# Patient Record
Sex: Female | Born: 1975 | Race: White | Hispanic: No | Marital: Married | Smoking: Current every day smoker
Health system: Southern US, Community
[De-identification: ages and names within clinical notes are randomized; demographics above are authoritative.]

## PROBLEM LIST (undated history)

## (undated) DIAGNOSIS — C50919 Malignant neoplasm of unspecified site of unspecified female breast: Secondary | ICD-10-CM

## (undated) DIAGNOSIS — Z923 Personal history of irradiation: Secondary | ICD-10-CM

## (undated) HISTORY — PX: ANKLE SURGERY: SHX546

## (undated) HISTORY — PX: BREAST LUMPECTOMY: SHX2

## (undated) HISTORY — DX: Malignant neoplasm of unspecified site of unspecified female breast: C50.919

---

## 1998-02-11 ENCOUNTER — Emergency Department (HOSPITAL_COMMUNITY): Admission: EM | Admit: 1998-02-11 | Discharge: 1998-02-11 | Payer: Self-pay | Admitting: *Deleted

## 1998-08-10 ENCOUNTER — Emergency Department (HOSPITAL_COMMUNITY): Admission: EM | Admit: 1998-08-10 | Discharge: 1998-08-10 | Payer: Self-pay | Admitting: Emergency Medicine

## 2000-03-25 ENCOUNTER — Emergency Department (HOSPITAL_COMMUNITY): Admission: EM | Admit: 2000-03-25 | Discharge: 2000-03-25 | Payer: Self-pay | Admitting: Emergency Medicine

## 2000-11-06 ENCOUNTER — Emergency Department (HOSPITAL_COMMUNITY): Admission: EM | Admit: 2000-11-06 | Discharge: 2000-11-06 | Payer: Self-pay | Admitting: Emergency Medicine

## 2002-11-18 ENCOUNTER — Emergency Department (HOSPITAL_COMMUNITY): Admission: EM | Admit: 2002-11-18 | Discharge: 2002-11-18 | Payer: Self-pay | Admitting: Emergency Medicine

## 2002-11-18 ENCOUNTER — Encounter: Payer: Self-pay | Admitting: Emergency Medicine

## 2007-07-25 ENCOUNTER — Emergency Department (HOSPITAL_COMMUNITY): Admission: EM | Admit: 2007-07-25 | Discharge: 2007-07-25 | Payer: Self-pay | Admitting: Emergency Medicine

## 2007-07-28 ENCOUNTER — Inpatient Hospital Stay (HOSPITAL_COMMUNITY): Admission: RE | Admit: 2007-07-28 | Discharge: 2007-07-29 | Payer: Self-pay | Admitting: Orthopaedic Surgery

## 2008-04-17 ENCOUNTER — Emergency Department (HOSPITAL_COMMUNITY): Admission: EM | Admit: 2008-04-17 | Discharge: 2008-04-17 | Payer: Self-pay | Admitting: Emergency Medicine

## 2008-07-08 ENCOUNTER — Emergency Department (HOSPITAL_COMMUNITY): Admission: EM | Admit: 2008-07-08 | Discharge: 2008-07-08 | Payer: Self-pay | Admitting: Emergency Medicine

## 2008-11-27 ENCOUNTER — Emergency Department (HOSPITAL_COMMUNITY): Admission: EM | Admit: 2008-11-27 | Discharge: 2008-11-27 | Payer: Self-pay | Admitting: Emergency Medicine

## 2010-09-05 ENCOUNTER — Emergency Department (HOSPITAL_COMMUNITY)
Admission: EM | Admit: 2010-09-05 | Discharge: 2010-09-05 | Payer: Self-pay | Source: Home / Self Care | Admitting: Emergency Medicine

## 2010-12-11 ENCOUNTER — Emergency Department: Payer: Self-pay | Admitting: Emergency Medicine

## 2010-12-21 NOTE — Op Note (Signed)
NAME:  Brittney Price, Brittney Price                 ACCOUNT NO.:  [id number]   MEDICAL RECORD NO.:  [id number]          PATIENT TYPE:  INP   LOCATION:  5018                         FACILITY:  [facility]   PHYSICIAN:  Vanita Panda. Magnus Ivan, M.D.DATE OF BIRTH:  01-02-1976   DATE OF PROCEDURE:  07/27/2007  DATE OF DISCHARGE:                               OPERATIVE REPORT   PREOPERATIVE DIAGNOSIS:  Right unstable trimalleolar ankle fracture.   POSTOPERATIVE DIAGNOSIS:  Right unstable trimalleolar ankle fracture.   PROCEDURE:  Open reduction internal fixation of right ankle lateral  malleolus and medial malleolus only.   SURGEON:  Vanita Panda. Magnus Ivan, MD.   ANESTHESIA:  General.   TOURNIQUET TIME:  One hour 41 minutes.   ANTIBIOTICS:  One gram IV Ancef.   INTRAOPERATIVE BLOOD LOSS:  Minimal.   COMPLICATIONS:  None.   INDICATIONS:  Briefly, [name] is a 35 year old who, 14 days ago,  sustained a complex right ankle fracture-dislocation.  She was seen in  the emergency room in [place], [place], and apparently set up  to see an Investment banker, operational and two days following this, to have  surgery.  However, for some reason she did not go to see this doctor.  After continued pain in her ankle and an obvious deformity in a splint,  she came to [facility] ER this past Wednesday and  was found to have a subluxed ankle.  I was in the middle of a trauma and  they called me and said that they had apparently had it in better  alignment and would send her to follow up immediately in my clinic the  next morning.  She showed up in clinic yesterday and had still a  subluxed ankle and fracture blisters on the medial side that looked like  they were old and had been healing.  I  recommended she undergo  immediate open reduction internal fixation of the fracture.  Risks and  benefits of this were explained her and well understood and she agreed  to proceed with surgery.   DESCRIPTION OF PROCEDURE:  After informed was obtained, appropriate  right leg was marked.  She is brought to the operating room, placed  supine on the operating table.  She was already in her splint and of  note, in the office yesterday, I did reduce the ankle to a better  aligned position.  Once she was placed on the table, general anesthesia  was then obtained.  A nonsterile tourniquet placed around her upper  right thigh and her right leg and ankle were prepped and draped with  DuraPrep and sterile drapes.  Time-out was called and she was identified  as the correct patient and correct extremity.  I then wrapped out the  ankle and foot with an Esmarch and tourniquet inflated 350 mm of  pressure.  An incision was made along the lateral fibula and this was  carried down to fibula site.  There was a long spiral fracture but no  significant comminution on the lateral side.  I cleaned the fracture  debris so I  was able to easily free the fracture up.  Using reduction  forceps, I was then able to reduce the fracture.  I placed a single 3.5  mm lag screw from anterior to posterior and oblique angle lagging the  fracture and this held it nicely.  I then placed a Borkenhagen [employer] Nephew  contoured fibula plate along the fibula and secured this proximally and  distally to buttress the ankle.  Next, attention was turned the medial  malleolus piece.  I made an incision directly over the medial malleolus  and again the fracture blisters on the medial side had healed and I  peeled away dead skin from this area.  This was carried down the  fracture site and  there was heavy comminution here and it was difficult  to ascertain which aspect of the fibula that helped bring it into an  anatomic position.  Once I was able to finally tease it into what I felt  to be anatomic position, I placed two guide pins for 4-0 cannulated  screws in a oblique fashion to the fracture site in a parallel fashion  as well.  I then  drilled this with 2 cannulated  drill bit and two  partially threaded 4-0 cancellous screws were placed lagging this  fracture.  I then did get some supplemental bone graft with G[place]on  including ortho blend mixture of chips and putty to put on the deficit  on the medial side.  Under direct fluoroscopic guidance, the ankle was  assessed and it was found to be stable including the ankle syndesmosis.  I then copiously irrigated all wounds and closed the deep tissue with  zero Vicryl on both sides.  This was followed by 2-0 Vicryl in the  subcutaneous tissue and interrupted 2-0 nylon in the skin on both sides.  Xeroform followed by well-padded sterile dressing was applied.  The  tourniquet was let down.  The toes pinkened nicely.  I put on a well-  padded posterior plaster splint with stirrups as well.  The patient was  awakened, extubated and taken to the recovery room in stable condition.  All final counts were correct and there were no complications noted.      Vanita Panda. Magnus Ivan, M.D.  Electronically Signed     CYB/MEDQ  D:  07/27/2007  T:  07/29/2007  Job:  [id number]

## 2011-02-06 ENCOUNTER — Emergency Department (HOSPITAL_COMMUNITY): Admission: EM | Admit: 2011-02-06 | Payer: Self-pay | Source: Home / Self Care

## 2011-05-12 LAB — URINE MICROSCOPIC-ADD ON

## 2011-05-12 LAB — WET PREP, GENITAL: Yeast Wet Prep HPF POC: NONE SEEN

## 2011-05-12 LAB — URINALYSIS, ROUTINE W REFLEX MICROSCOPIC
Bilirubin Urine: NEGATIVE
Glucose, UA: NEGATIVE mg/dL
Hgb urine dipstick: NEGATIVE
Specific Gravity, Urine: 1.024 (ref 1.005–1.030)
Urobilinogen, UA: 1 mg/dL (ref 0.0–1.0)

## 2011-05-12 LAB — RPR: RPR Ser Ql: NONREACTIVE

## 2011-05-12 LAB — GC/CHLAMYDIA PROBE AMP, GENITAL: GC Probe Amp, Genital: NEGATIVE

## 2011-05-13 LAB — BASIC METABOLIC PANEL
BUN: 9
CO2: 29
Chloride: 101
Creatinine, Ser: 0.63
Glucose, Bld: 112 — ABNORMAL HIGH
Potassium: 3.9

## 2011-05-13 LAB — CBC
HCT: 40.2
MCHC: 34.1
MCV: 91
Platelets: 233
RDW: 12.4

## 2011-08-15 ENCOUNTER — Emergency Department (HOSPITAL_BASED_OUTPATIENT_CLINIC_OR_DEPARTMENT_OTHER)
Admission: EM | Admit: 2011-08-15 | Discharge: 2011-08-16 | Disposition: A | Discharge status: Home / Self Care | Payer: Self-pay | Attending: Emergency Medicine | Admitting: Emergency Medicine

## 2011-08-15 ENCOUNTER — Encounter: Payer: Self-pay | Admitting: *Deleted

## 2011-08-15 DIAGNOSIS — F17200 Nicotine dependence, unspecified, uncomplicated: Secondary | ICD-10-CM | POA: Insufficient documentation

## 2011-08-15 DIAGNOSIS — L259 Unspecified contact dermatitis, unspecified cause: Secondary | ICD-10-CM | POA: Insufficient documentation

## 2011-08-15 NOTE — ED Notes (Signed)
Pt reports she noticed rash on her hands tonight before going into work- denies known exposure

## 2011-08-16 MED ORDER — PREDNISONE 50 MG PO TABS
ORAL_TABLET | ORAL | Status: AC
  Filled 2011-08-16: qty 1

## 2011-08-16 MED ORDER — FAMOTIDINE 20 MG PO TABS: 20.0000 mg | ORAL_TABLET | Freq: Two times a day (BID) | ORAL | Status: DC

## 2011-08-16 MED ORDER — PREDNISONE 50 MG PO TABS
60.0000 mg | ORAL_TABLET | Freq: Every day | ORAL | Status: DC
  Administered 2011-08-16: 60 mg via ORAL

## 2011-08-16 MED ORDER — DIPHENHYDRAMINE HCL 25 MG PO TABS: 50.0000 mg | ORAL_TABLET | Freq: Four times a day (QID) | ORAL | Status: AC | PRN

## 2011-08-16 MED ORDER — FAMOTIDINE 20 MG PO TABS
20.0000 mg | ORAL_TABLET | Freq: Once | ORAL | Status: AC
  Administered 2011-08-16: 20 mg via ORAL
  Filled 2011-08-16: qty 1

## 2011-08-16 MED ORDER — DIPHENHYDRAMINE HCL 50 MG PO CAPS
50.0000 mg | ORAL_CAPSULE | Freq: Once | ORAL | Status: AC
  Administered 2011-08-16: 50 mg via ORAL
  Filled 2011-08-16: qty 1

## 2011-08-16 MED ORDER — PREDNISONE 10 MG PO TABS
ORAL_TABLET | ORAL | Status: AC
  Filled 2011-08-16: qty 1

## 2011-08-16 MED ORDER — DIPHENHYDRAMINE HCL 25 MG PO CAPS
ORAL_CAPSULE | ORAL | Status: AC
  Administered 2011-08-16: 50 mg via ORAL
  Filled 2011-08-16: qty 2

## 2011-08-16 NOTE — ED Provider Notes (Signed)
History     CSN: [id number]  Arrival date & time 08/15/11  2300   First MD Initiated Contact with Patient 08/15/11 2311      Chief Complaint  Patient presents with  . Rash    (Consider location/radiation/quality/duration/timing/severity/associated sxs/prior treatment) The history is provided by the patient.   patient reports developing a rash on her hands and forearms prior to going to work this evening.  She reports it itches.  She denies hives.  She has no prior history of anaphylaxis, allergic reaction, eczema, asthma or other food insensitivities.  She has not tried anything for the rash.  She has not attempted Benadryl.  She has no changes in lotions soaps or detergents.  She reports she's been at her job for 10 years and nothing seems to change that at that would be a new exposure.  Nothing worsens her symptoms.  Nothing improves her symptoms.  Her symptoms are constant.  History reviewed. No pertinent past medical history.  Past Surgical History  Procedure Date  . Ankle surgery     No family history on file.  History  Substance Use Topics  . Smoking status: Current Everyday Smoker  . Smokeless tobacco: Never Used  . Alcohol Use: Yes     occasional    OB History    Grav Para Term Preterm Abortions TAB SAB Ect Mult Living                  Review of Systems  All other systems reviewed and are negative.    Allergies  Review of patient's allergies indicates no known allergies.  Home Medications   Current Outpatient Rx  Name Route Sig Dispense Refill  . DIPHENHYDRAMINE HCL 25 MG PO TABS Oral Take 2 tablets (50 mg total) by mouth every 6 (six) hours as needed for itching. 20 tablet 0  . FAMOTIDINE 20 MG PO TABS Oral Take 1 tablet (20 mg total) by mouth 2 (two) times daily. 7 tablet 0    BP 112/63  Pulse 67  Temp(Src) 98.9 F (37.2 C) (Oral)  Resp 18  Ht 5\' 8"  (1.727 m)  Wt 160 lb (72.576 kg)  BMI 24.33 kg/m2  SpO2 100%  LMP 07/25/2011  Physical Exam    Nursing note and vitals reviewed. Constitutional: She is oriented to person, place, and time. She appears well-developed and well-nourished. No distress.  HENT:  Head: Normocephalic and atraumatic.       Speech is normal.  No stridor.  Eyes: EOM are normal.  Neck: Normal range of motion.  Cardiovascular: Regular rhythm.   Pulmonary/Chest: Effort normal. No stridor.  Abdominal: Soft.  Musculoskeletal: Normal range of motion.  Neurological: She is alert and oriented to person, place, and time.  Skin:       Diffuse erythema and warmth with evidence of light excoriations.  No hives noted.  Psychiatric: She has a normal mood and affect. Judgment normal.    ED Course  Procedures (including critical care time)  Labs Reviewed - No data to display No results found.   1. Contact dermatitis       MDM  Other contact dermatitis versus allergic reaction.  Will be treated as such.  No signs of anaphylaxis.  Patient is well-appearing.  Home with instructions for Benadryl and Pepcid.  She's been given a single dose of prednisone here in the emergency department.  She was instructed to return to the ER for new or worsening symptoms  Lyanne Co, MD 08/16/11 [phone]

## 2012-05-21 ENCOUNTER — Emergency Department (HOSPITAL_COMMUNITY)
Admission: EM | Admit: 2012-05-21 | Discharge: 2012-05-21 | Disposition: A | Discharge status: Home / Self Care | Payer: Self-pay | Attending: Emergency Medicine | Admitting: Emergency Medicine

## 2012-05-21 ENCOUNTER — Encounter (HOSPITAL_COMMUNITY): Payer: Self-pay | Admitting: Emergency Medicine

## 2012-05-21 DIAGNOSIS — R5381 Other malaise: Secondary | ICD-10-CM | POA: Insufficient documentation

## 2012-05-21 DIAGNOSIS — R5383 Other fatigue: Secondary | ICD-10-CM

## 2012-05-21 DIAGNOSIS — R112 Nausea with vomiting, unspecified: Secondary | ICD-10-CM | POA: Insufficient documentation

## 2012-05-21 DIAGNOSIS — F17200 Nicotine dependence, unspecified, uncomplicated: Secondary | ICD-10-CM | POA: Insufficient documentation

## 2012-05-21 NOTE — ED Notes (Signed)
Pt sts N/V yesterday that is now resolved and some fatigue but sts is here for work note

## 2012-05-22 NOTE — ED Provider Notes (Signed)
History     CSN: [id number]  Arrival date & time 05/21/12  1429   First MD Initiated Contact with Patient 05/21/12 1853      Chief Complaint  Patient presents with  . Nausea  . Fatigue    (Consider location/radiation/quality/duration/timing/severity/associated sxs/prior treatment) HPISusan R Price is a 36 y.o. female who awoke early this morning and had 6 episodes of nausea and vomiting. This was associated with some fatigue. This was severe for a short period of time. She says this is resolved and she's been tolerating food and fluids well. Patient presents to the emergency department because she wanted a work note so that she may return to her job at [employer]. Currently she denies any chest pain, shortness of breath, fevers or chills, hematemesis, hematochezia or melena. Symptoms have completely resolved and she has not taken anything for them. She refuses any treatment at this time-she says she does not want to take medications as she feels fine  History reviewed. No pertinent past medical history.  Past Surgical History  Procedure Date  . Ankle surgery     History reviewed. No pertinent family history.  History  Substance Use Topics  . Smoking status: Current Every Day Smoker  . Smokeless tobacco: Never Used  . Alcohol Use: Yes     occasional    OB History    Grav Para Term Preterm Abortions TAB SAB Ect Mult Living                  Review of Systems  Allergies  Review of patient's allergies indicates no known allergies.  Home Medications  No current outpatient prescriptions on file.  BP 128/79  Pulse 68  Temp 98.5 F (36.9 C) (Oral)  Resp 16  SpO2 98%  Physical Exam  Nursing notes reviewed.  Electronic medical record reviewed. VITAL SIGNS:   Filed Vitals:   05/21/12 1446  BP: 128/79  Pulse: 68  Temp: 98.5 F (36.9 C)  TempSrc: Oral  Resp: 16  SpO2: 98%   CONSTITUTIONAL: Awake, oriented, appears non-toxic HENT: Atraumatic, normocephalic,  oral mucosa pink and moist, airway patent. Nares patent without drainage. External ears normal. EYES: Conjunctiva clear, EOMI, PERRLA NECK: Trachea midline, non-tender, supple CARDIOVASCULAR: Normal heart rate, Normal rhythm, No murmurs, rubs, gallops PULMONARY/CHEST: Clear to auscultation, no rhonchi, wheezes, or rales. Symmetrical breath sounds. Non-tender. ABDOMINAL: Non-distended, soft, non-tender - no rebound or guarding.  BS normal. NEUROLOGIC: Non-focal, moving all four extremities, no gross sensory or motor deficits. EXTREMITIES: No clubbing, cyanosis, or edema SKIN: Warm, Dry, No erythema, No rash  ED Course  Procedures (including critical care time)  Labs Reviewed - No data to display No results found.   1. Nausea and vomiting   2. Fatigue       MDM  Brittney Price is a 36 y.o. female presents with self resolving nausea and vomiting. Patient like work note so that she may return to her job at [employer]. Work note will be provided. Patient refuses Zofran. Vital signs are stable and within normal limits-she does not require any IV therapy. Do not think any imaging or laboratory studies are indicated at this time. We discharged home stable and good condition and instructed to return to the ER she is unable to keep down fluids by mouth. She's questions are answered she agrees to medical plan and is discharged home.         Jones Skene, MD 05/22/12 4098

## 2012-12-31 ENCOUNTER — Encounter (HOSPITAL_COMMUNITY): Payer: Self-pay | Admitting: Emergency Medicine

## 2012-12-31 ENCOUNTER — Emergency Department (HOSPITAL_COMMUNITY)
Admission: EM | Admit: 2012-12-31 | Discharge: 2012-12-31 | Disposition: A | Discharge status: Home / Self Care | Payer: Self-pay | Attending: Emergency Medicine | Admitting: Emergency Medicine

## 2012-12-31 DIAGNOSIS — F17200 Nicotine dependence, unspecified, uncomplicated: Secondary | ICD-10-CM | POA: Insufficient documentation

## 2012-12-31 DIAGNOSIS — L509 Urticaria, unspecified: Secondary | ICD-10-CM | POA: Insufficient documentation

## 2012-12-31 MED ORDER — HYDROCORTISONE 1 % EX CREA: TOPICAL_CREAM | CUTANEOUS | Status: DC

## 2012-12-31 NOTE — ED Provider Notes (Signed)
Medical screening examination/treatment/procedure(s) were performed by non-physician practitioner and as supervising physician I was immediately available for consultation/collaboration.  Dalon Reichart, MD 12/31/12 1525 

## 2012-12-31 NOTE — ED Provider Notes (Signed)
History     CSN: [id number]  Arrival date & time 12/31/12  1212   First MD Initiated Contact with Patient 12/31/12 1257      Chief Complaint  Patient presents with  . Rash    B/L arms    (Consider location/radiation/quality/duration/timing/severity/associated sxs/prior treatment) HPI  Pt to the ER for hives to bilateral arms. Unsure of what she came into contact with but has a new body wash. Denies it being on any other part of her body. Denies it being painful or pruritic. She noticed it when her daughter pointed it out yesterday. She used her brothers hydrocortisone cream on the area and it got  better afterwards. No CP, SOB, n/v/d, weakness or fevers. nad vss  History reviewed. No pertinent past medical history.  Past Surgical History  Procedure Laterality Date  . Ankle surgery      No family history on file.  History  Substance Use Topics  . Smoking status: Current Every Day Smoker  . Smokeless tobacco: Never Used  . Alcohol Use: Yes     Comment: occasional    OB History   Grav Para Term Preterm Abortions TAB SAB Ect Mult Living                  Review of Systems  All other systems reviewed and are negative.    Allergies  Review of patient's allergies indicates no known allergies.  Home Medications   Current Outpatient Rx  Name  Route  Sig  Dispense  Refill  . ibuprofen (ADVIL,MOTRIN) 200 MG tablet   Oral   Take 400 mg by mouth every 6 (six) hours as needed for pain.         . Multiple Vitamin (MULTIVITAMIN WITH MINERALS) TABS   Oral   Take 1 tablet by mouth daily.         . hydrocortisone cream 1 %      Apply to affected area 2 times daily   30 g   0     BP 123/75  Pulse 82  Temp(Src) 98.7 F (37.1 C) (Oral)  Resp 16  SpO2 96%  LMP 12/24/2012  Physical Exam  Nursing note and vitals reviewed. Constitutional: She appears well-developed and well-nourished. No distress.  HENT:  Head: Normocephalic and atraumatic.  Eyes: Pupils  are equal, round, and reactive to light.  Neck: Normal range of motion. Neck supple.  Cardiovascular: Normal rate and regular rhythm.   Pulmonary/Chest: Effort normal.  Abdominal: Soft.  Neurological: She is alert.  Skin: Skin is warm and dry.       ED Course  Procedures (including critical care time)  Labs Reviewed - No data to display No results found.   1. Urticaria       MDM  Rx Hydrocortisone cream  Referral to derm.  Pt has been advised of the symptoms that warrant their return to the ED. Patient has voiced understanding and has agreed to follow-up with the PCP or specialist.         Dorthula Matas, PA-C 12/31/12 1326

## 2012-12-31 NOTE — ED Notes (Signed)
Pt reports noticed red raised rash to B/L arms. Pt denies itching. Pt has been using a new body wash from [facility] x 2 weeks. Pt has not noticed any other areas with rash.

## 2013-05-19 ENCOUNTER — Encounter (HOSPITAL_COMMUNITY): Payer: Self-pay | Admitting: Emergency Medicine

## 2013-05-19 ENCOUNTER — Emergency Department (HOSPITAL_COMMUNITY)
Admission: EM | Admit: 2013-05-19 | Discharge: 2013-05-19 | Disposition: A | Discharge status: Home / Self Care | Payer: Self-pay | Attending: Emergency Medicine | Admitting: Emergency Medicine

## 2013-05-19 DIAGNOSIS — Z3202 Encounter for pregnancy test, result negative: Secondary | ICD-10-CM | POA: Insufficient documentation

## 2013-05-19 DIAGNOSIS — M779 Enthesopathy, unspecified: Secondary | ICD-10-CM

## 2013-05-19 DIAGNOSIS — A499 Bacterial infection, unspecified: Secondary | ICD-10-CM | POA: Insufficient documentation

## 2013-05-19 DIAGNOSIS — Z79899 Other long term (current) drug therapy: Secondary | ICD-10-CM | POA: Insufficient documentation

## 2013-05-19 DIAGNOSIS — F17200 Nicotine dependence, unspecified, uncomplicated: Secondary | ICD-10-CM | POA: Insufficient documentation

## 2013-05-19 DIAGNOSIS — N760 Acute vaginitis: Secondary | ICD-10-CM | POA: Insufficient documentation

## 2013-05-19 DIAGNOSIS — Z202 Contact with and (suspected) exposure to infections with a predominantly sexual mode of transmission: Secondary | ICD-10-CM | POA: Insufficient documentation

## 2013-05-19 DIAGNOSIS — Z9889 Other specified postprocedural states: Secondary | ICD-10-CM | POA: Insufficient documentation

## 2013-05-19 DIAGNOSIS — B9689 Other specified bacterial agents as the cause of diseases classified elsewhere: Secondary | ICD-10-CM | POA: Insufficient documentation

## 2013-05-19 DIAGNOSIS — M65839 Other synovitis and tenosynovitis, unspecified forearm: Secondary | ICD-10-CM | POA: Insufficient documentation

## 2013-05-19 LAB — URINALYSIS, ROUTINE W REFLEX MICROSCOPIC
Bilirubin Urine: NEGATIVE
Ketones, ur: NEGATIVE mg/dL
Nitrite: NEGATIVE
Protein, ur: NEGATIVE mg/dL
pH: 5.5 (ref 5.0–8.0)

## 2013-05-19 LAB — WET PREP, GENITAL: Trich, Wet Prep: NONE SEEN

## 2013-05-19 LAB — URINE MICROSCOPIC-ADD ON

## 2013-05-19 MED ORDER — HYDROCODONE-ACETAMINOPHEN 5-325 MG PO TABS: 1.0000 | ORAL_TABLET | Freq: Four times a day (QID) | ORAL | Status: DC | PRN

## 2013-05-19 MED ORDER — METRONIDAZOLE 500 MG PO TABS: 500.0000 mg | ORAL_TABLET | Freq: Two times a day (BID) | ORAL | Status: DC

## 2013-05-19 NOTE — ED Provider Notes (Signed)
CSN: [id number]     Arrival date & time 05/19/13  1750 History   First MD Initiated Contact with Patient 05/19/13 1817     Chief Complaint  Patient presents with  . Wrist Pain  . Arm Pain  . Exposure to STD   (Consider location/radiation/quality/duration/timing/severity/associated sxs/prior Treatment) HPI Comments: Pt states that she is here for multiple complaints pt that that she is having pain in the medial aspect of the right wrist:pt states that she does a lot repetitive motion at work:pt states that she also has some left shoulder pain when she does heavy lifting and her arms are out:pt states that she is a homosexual relationship and her partner has had multiple other partners and she would like to be tested for stds including hiv  The history is provided by the patient. No language interpreter was used.    History reviewed. No pertinent past medical history. Past Surgical History  Procedure Laterality Date  . Ankle surgery     History reviewed. No pertinent family history. History  Substance Use Topics  . Smoking status: Current Every Day Smoker  . Smokeless tobacco: Never Used  . Alcohol Use: Yes     Comment: occasional   OB History   Grav Para Term Preterm Abortions TAB SAB Ect Mult Living                 Review of Systems  Constitutional: Negative.   Respiratory: Negative.   Cardiovascular: Negative.     Allergies  Review of patient's allergies indicates no known allergies.  Home Medications   Current Outpatient Rx  Name  Route  Sig  Dispense  Refill  . hydrocortisone cream 1 %      Apply to affected area 2 times daily   30 g   0   . ibuprofen (ADVIL,MOTRIN) 200 MG tablet   Oral   Take 400 mg by mouth every 6 (six) hours as needed for pain.         . Multiple Vitamin (MULTIVITAMIN WITH MINERALS) TABS   Oral   Take 1 tablet by mouth daily.          BP 124/87  Pulse 61  Temp(Src) 98.1 F (36.7 C) (Oral)  Resp 16  Ht 5\' 8"  (1.727 m)  Wt  155 lb (70.308 kg)  BMI 23.57 kg/m2  SpO2 99% Physical Exam  Nursing note and vitals reviewed. Constitutional: She is oriented to person, place, and time. She appears well-developed and well-nourished.  HENT:  Head: Normocephalic and atraumatic.  Eyes: Conjunctivae and EOM are normal.  Cardiovascular: Normal rate and regular rhythm.   Pulmonary/Chest: Effort normal and breath sounds normal.  Abdominal: Soft. Bowel sounds are normal.  Genitourinary: Vaginal discharge found.  -cmt  Musculoskeletal: Normal range of motion.  Tender in the medial aspect of the right wrist:pt has full rom:no gross deformity noted:left arm non tender to palpation  Neurological: She is alert and oriented to person, place, and time.  Skin: Skin is warm and dry.    ED Course  Procedures (including critical care time) Labs Review Labs Reviewed  WET PREP, GENITAL - Abnormal; Notable for the following:    Clue Cells Wet Prep HPF POC FEW (*)    WBC, Wet Prep HPF POC MANY (*)    All other components within normal limits  URINALYSIS, ROUTINE W REFLEX MICROSCOPIC - Abnormal; Notable for the following:    Leukocytes, UA MODERATE (*)    All other components within  normal limits  URINE MICROSCOPIC-ADD ON - Abnormal; Notable for the following:    Squamous Epithelial / LPF FEW (*)    All other components within normal limits  GC/CHLAMYDIA PROBE AMP  RPR  HIV ANTIBODY (ROUTINE TESTING)   Imaging Review No results found.  EKG Interpretation   None       MDM   1. Tendonitis   2. BV (bacterial vaginosis)   3. Possible exposure to STD    Std cultures sent:pt placed in wrist splint and given ortho follow up:pt sent home with flagyl and vicodin    Teressa Lower, NP 05/19/13 1935

## 2013-05-19 NOTE — ED Notes (Signed)
Pt reports left arm pain that occurs when reaching to get something and extending her arm, having pain to right wrist from repetitive motion at work. Also requesting to get checked for stds due to possible exposure but denies any symptoms.

## 2013-05-19 NOTE — Progress Notes (Signed)
Orthopedic Tech Progress Note Patient Details:  Brittney Price December 02, 1975 [id number]  Ortho Devices Type of Ortho Device: Velcro wrist splint Ortho Device/Splint Location: rue Ortho Device/Splint Interventions: Application   Nikki Dom 05/19/2013, 8:32 PM

## 2013-05-19 NOTE — ED Provider Notes (Signed)
Medical screening examination/treatment/procedure(s) were performed by non-physician practitioner and as supervising physician I was immediately available for consultation/collaboration.  Kristen N Ward, DO 05/19/13 2010 

## 2013-05-20 LAB — HIV ANTIBODY (ROUTINE TESTING W REFLEX): HIV: NONREACTIVE

## 2013-05-20 LAB — GC/CHLAMYDIA PROBE AMP: GC Probe RNA: NEGATIVE

## 2013-05-20 LAB — POCT PREGNANCY, URINE: Preg Test, Ur: NEGATIVE

## 2013-06-09 ENCOUNTER — Encounter (HOSPITAL_COMMUNITY): Payer: Self-pay | Admitting: Emergency Medicine

## 2013-06-09 ENCOUNTER — Emergency Department (HOSPITAL_COMMUNITY)
Admission: EM | Admit: 2013-06-09 | Discharge: 2013-06-09 | Disposition: A | Discharge status: Home / Self Care | Payer: Self-pay | Attending: Emergency Medicine | Admitting: Emergency Medicine

## 2013-06-09 DIAGNOSIS — R197 Diarrhea, unspecified: Secondary | ICD-10-CM | POA: Insufficient documentation

## 2013-06-09 DIAGNOSIS — Z79899 Other long term (current) drug therapy: Secondary | ICD-10-CM | POA: Insufficient documentation

## 2013-06-09 DIAGNOSIS — F17200 Nicotine dependence, unspecified, uncomplicated: Secondary | ICD-10-CM | POA: Insufficient documentation

## 2013-06-09 DIAGNOSIS — R5381 Other malaise: Secondary | ICD-10-CM | POA: Insufficient documentation

## 2013-06-09 DIAGNOSIS — J029 Acute pharyngitis, unspecified: Secondary | ICD-10-CM | POA: Insufficient documentation

## 2013-06-09 NOTE — ED Provider Notes (Signed)
CSN: [id number]     Arrival date & time 06/09/13  1546 History   First MD Initiated Contact with Patient 06/09/13 1603     This chart was scribed for Marlin Canary, by Ladona Ridgel Day, ED scribe. This patient was seen in room TR05C/TR05C and the patient's care was started at 1603.  Chief Complaint  Patient presents with  . Sore Throat   Patient is a 37 y.o. female presenting with pharyngitis. The history is provided by the patient. No language interpreter was used.  Sore Throat This is a new problem. The current episode started yesterday. The problem occurs constantly. The problem has not changed since onset.Pertinent negatives include no headaches and no shortness of breath. Nothing aggravates the symptoms. Nothing relieves the symptoms. She has tried nothing for the symptoms.   HPI Comments: Brittney Price is a 37 y.o. female who presents to the Emergency Department complaining of constant, gradually worsened sore throat, onset last PM and concerned that she might have strept throat. She states associated congestion, cough, diarrhea and mild myalgias. She denies HA, sinus pressure. She has 1 previous episode of strept throat. She is unsure of any fever.   History reviewed. No pertinent past medical history. Past Surgical History  Procedure Laterality Date  . Ankle surgery     No family history on file. History  Substance Use Topics  . Smoking status: Current Every Day Smoker  . Smokeless tobacco: Never Used  . Alcohol Use: Yes     Comment: occasional   OB History   Grav Para Term Preterm Abortions TAB SAB Ect Mult Living                 Review of Systems  Constitutional: Negative for fever and chills.  HENT: Positive for congestion and sore throat. Negative for ear pain.   Respiratory: Positive for cough. Negative for shortness of breath.   Gastrointestinal: Negative for nausea and vomiting.  Neurological: Negative for weakness and headaches.  All other systems reviewed and are  negative.   A complete 10 system review of systems was obtained and all systems are negative except as noted in the HPI and PMH.   Allergies  Review of patient's allergies indicates no known allergies.  Home Medications   Current Outpatient Rx  Name  Route  Sig  Dispense  Refill  . HYDROcodone-acetaminophen (NORCO/VICODIN) 5-325 MG per tablet   Oral   Take 1-2 tablets by mouth every 6 (six) hours as needed for pain.   10 tablet   0   . metroNIDAZOLE (FLAGYL) 500 MG tablet   Oral   Take 1 tablet (500 mg total) by mouth 2 (two) times daily.   14 tablet   0    Triage Vitals: BP 115/71  Pulse 60  Temp(Src) 98.5 F (36.9 C) (Oral)  Resp 18  SpO2 98%  LMP 06/03/2013 Physical Exam  Nursing note and vitals reviewed. Constitutional: She is oriented to person, place, and time. She appears well-developed and well-nourished. No distress.  HENT:  Head: Normocephalic and atraumatic.  Right Ear: External ear normal.  Left Ear: External ear normal.  Mouth/Throat: No oropharyngeal exudate.  Posterior oropharynx mildly erythematous. Tonsils not swollen.   Eyes: EOM are normal.  Neck: Neck supple. No tracheal deviation present.  Cardiovascular: Normal rate.   Pulmonary/Chest: Effort normal. No respiratory distress.  Musculoskeletal: Normal range of motion.  Neurological: She is alert and oriented to person, place, and time.  Skin: Skin is warm and  dry.  Psychiatric: She has a normal mood and affect. Her behavior is normal.    ED Course  Procedures (including critical care time) DIAGNOSTIC STUDIES: Oxygen Saturation is 98% on room air, normal by my interpretation.    COORDINATION OF CARE: At 428 PM Discussed treatment plan with patient which includes rapid strept screen, instructions for salt water gargling for at home treatment. Patient agrees.   Labs Review Labs Reviewed  RAPID STREP SCREEN   Imaging Review No results found.  EKG Interpretation   None       MDM    1. Pharyngitis    Two days history of mild sore throat and previous history of URI symptoms. No fever or chills. Well-appearing, tonsils not edematous, no exudate. Symptomatic treatment of probable viral illness. Increase fluids, rest and use nasacort AQ for sinus congestion.   I personally performed the services described in this documentation, which was scribed in my presence. The recorded information has been reviewed and is accurate.       Irish Elders, NP 06/09/13 [phone]

## 2013-06-09 NOTE — ED Provider Notes (Signed)
Medical screening examination/treatment/procedure(s) were performed by non-physician practitioner and as supervising physician I was immediately available for consultation/collaboration.  EKG Interpretation   None         Megan E Docherty, MD 06/09/13 2328 

## 2013-06-09 NOTE — ED Notes (Signed)
The pt has a sore throat since Friday.  She may have had a temp

## 2013-06-11 LAB — CULTURE, GROUP A STREP

## 2013-08-14 ENCOUNTER — Emergency Department (HOSPITAL_COMMUNITY)
Admission: EM | Admit: 2013-08-14 | Discharge: 2013-08-14 | Disposition: A | Discharge status: Home / Self Care | Payer: Self-pay | Attending: Emergency Medicine | Admitting: Emergency Medicine

## 2013-08-14 ENCOUNTER — Emergency Department (HOSPITAL_COMMUNITY): Payer: Self-pay

## 2013-08-14 ENCOUNTER — Encounter (HOSPITAL_COMMUNITY): Payer: Self-pay | Admitting: Emergency Medicine

## 2013-08-14 DIAGNOSIS — J111 Influenza due to unidentified influenza virus with other respiratory manifestations: Secondary | ICD-10-CM | POA: Insufficient documentation

## 2013-08-14 DIAGNOSIS — F17200 Nicotine dependence, unspecified, uncomplicated: Secondary | ICD-10-CM | POA: Insufficient documentation

## 2013-08-14 DIAGNOSIS — H9209 Otalgia, unspecified ear: Secondary | ICD-10-CM | POA: Insufficient documentation

## 2013-08-14 DIAGNOSIS — R5381 Other malaise: Secondary | ICD-10-CM | POA: Insufficient documentation

## 2013-08-14 DIAGNOSIS — R5383 Other fatigue: Secondary | ICD-10-CM

## 2013-08-14 DIAGNOSIS — R69 Illness, unspecified: Secondary | ICD-10-CM

## 2013-08-14 MED ORDER — KETOROLAC TROMETHAMINE 30 MG/ML IJ SOLN
30.0000 mg | Freq: Once | INTRAMUSCULAR | Status: AC
  Administered 2013-08-14: 30 mg via INTRAVENOUS
  Filled 2013-08-14: qty 1

## 2013-08-14 MED ORDER — IBUPROFEN 800 MG PO TABS: 800.0000 mg | ORAL_TABLET | Freq: Three times a day (TID) | ORAL | Status: DC | PRN

## 2013-08-14 MED ORDER — PROMETHAZINE-DM 6.25-15 MG/5ML PO SYRP: 5.0000 mL | ORAL_SOLUTION | Freq: Four times a day (QID) | ORAL | Status: DC | PRN

## 2013-08-14 MED ORDER — SODIUM CHLORIDE 0.9 % IV BOLUS (SEPSIS)
1000.0000 mL | Freq: Once | INTRAVENOUS | Status: AC
  Administered 2013-08-14: 1000 mL via INTRAVENOUS

## 2013-08-14 MED ORDER — GUAIFENESIN ER 1200 MG PO TB12: 1.0000 | ORAL_TABLET | Freq: Two times a day (BID) | ORAL | Status: DC

## 2013-08-14 NOTE — ED Notes (Signed)
Patient is alert and orientedx4.  Patient was explained discharge instructions and they understood them with no questions.  Brittney CourtGabrielle Gavel, a friend is taking the patient home.

## 2013-08-14 NOTE — ED Notes (Signed)
Family at bedside. 

## 2013-08-14 NOTE — ED Provider Notes (Signed)
CSN: [id number]     Arrival date & time 08/14/13  0736 History   First MD Initiated Contact with Patient 08/14/13 [phone]     Chief Complaint  Patient presents with  . Generalized Body Aches   (Consider location/radiation/quality/duration/timing/severity/associated sxs/prior Treatment) Patient is a 38 y.o. female presenting with URI.  URI Presenting symptoms: congestion, cough, ear pain, fatigue, fever, rhinorrhea and sore throat   Severity:  Moderate Onset quality:  Gradual Duration:  3 days Timing:  Constant Progression:  Worsening Chronicity:  New Relieved by:  Nothing Worsened by:  Nothing tried Ineffective treatments:  Drinking Associated symptoms: myalgias   Associated symptoms: no headaches   Risk factors: no chronic cardiac disease, no chronic kidney disease, no chronic respiratory disease, no immunosuppression, no recent illness, no recent travel and no sick contacts     History reviewed. No pertinent past medical history. Past Surgical History  Procedure Laterality Date  . Ankle surgery     History reviewed. No pertinent family history. History  Substance Use Topics  . Smoking status: Current Every Day Smoker -- 0.50 packs/day  . Smokeless tobacco: Never Used  . Alcohol Use: Yes     Comment: occasional   OB History   Grav Para Term Preterm Abortions TAB SAB Ect Mult Living                 Review of Systems  Constitutional: Positive for fever, chills, activity change, appetite change and fatigue. Negative for diaphoresis.  HENT: Positive for congestion, ear pain, postnasal drip, rhinorrhea and sore throat. Negative for ear discharge, facial swelling and mouth sores.   Respiratory: Positive for cough.   Cardiovascular: Negative for chest pain and palpitations.  Gastrointestinal: Negative for nausea, abdominal pain and diarrhea.  Endocrine: Negative for polydipsia, polyphagia and polyuria.  Genitourinary: Negative for dysuria.  Musculoskeletal: Positive for  myalgias.  Skin: Negative for color change, pallor and rash.  Neurological: Negative for dizziness, light-headedness and headaches.   All other systems negative except as documented in the HPI. All pertinent positives and negatives as reviewed in the HPI.  Allergies  Review of patient's allergies indicates no known allergies.  Home Medications   Current Outpatient Rx  Name  Route  Sig  Dispense  Refill  . ibuprofen (ADVIL,MOTRIN) 200 MG tablet   Oral   Take 400 mg by mouth every 6 (six) hours as needed.         . Pseudoeph-Doxylamine-DM-APAP (NYQUIL PO)   Oral   Take 1 capsule by mouth at bedtime as needed (congestion).         . Pseudoephedrine-APAP-DM (DAYQUIL PO)   Oral   Take 1 capsule by mouth daily as needed (congestion).          BP 119/78  Pulse 56  Temp(Src) 98.4 F (36.9 C) (Oral)  Resp 14  Ht 5\' 8"  (1.727 m)  Wt 160 lb (72.576 kg)  BMI 24.33 kg/m2  SpO2 97%  LMP 08/05/2013 Physical Exam  Nursing note and vitals reviewed. Constitutional: She is oriented to person, place, and time. She appears well-developed and well-nourished. No distress.  HENT:  Head: Normocephalic and atraumatic.  Mouth/Throat: Oropharynx is clear and moist.  Eyes: Pupils are equal, round, and reactive to light.  Neck: Normal range of motion. Neck supple.  Cardiovascular: Normal rate, regular rhythm and normal heart sounds.  Exam reveals no gallop and no friction rub.   No murmur heard. Pulmonary/Chest: Effort normal and breath sounds normal. No respiratory distress.  She has no wheezes.  Neurological: She is alert and oriented to person, place, and time.  Skin: Skin is warm and dry.    ED Course  Procedures (including critical care time) Labs Review Labs Reviewed - No data to display Imaging Review Dg Chest 2 View  08/14/2013   CLINICAL DATA:  Cough and fever  EXAM: CHEST  2 VIEW  COMPARISON:  None.  FINDINGS: The heart size and mediastinal contours are within normal limits.  Both lungs are clear. The visualized skeletal structures are unremarkable.  IMPRESSION: No active cardiopulmonary disease.   Electronically Signed   By: Ruel Favorsrevor  Shick M.D.   On: 08/14/2013 08:30     Patient will be treated for ILI and told to return here as needed. Follow up with her PCP. Told to increase her fluid intake.Rest as much as possible.  Carlyle DollyChristopher W Daegon Deiss, PA-C 08/14/13 1030

## 2013-08-14 NOTE — ED Notes (Signed)
Patient transported to X-ray 

## 2013-08-14 NOTE — ED Notes (Signed)
Patient started having flu like symptoms since Saturday.  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 She worked Monday and tried to work Tuesday and could not, she felt ill.  She thought it would get better but got worse to she came to ED to be evaluated.

## 2013-08-14 NOTE — Discharge Instructions (Signed)
Return here as needed. Follow up with your doctor. Increase your fluids and rest as much as possible.

## 2013-08-20 ENCOUNTER — Emergency Department (HOSPITAL_COMMUNITY)
Admission: EM | Admit: 2013-08-20 | Discharge: 2013-08-20 | Disposition: A | Discharge status: Home / Self Care | Payer: Self-pay | Attending: Emergency Medicine | Admitting: Emergency Medicine

## 2013-08-20 ENCOUNTER — Encounter (HOSPITAL_COMMUNITY): Payer: Self-pay | Admitting: Emergency Medicine

## 2013-08-20 DIAGNOSIS — S335XXA Sprain of ligaments of lumbar spine, initial encounter: Secondary | ICD-10-CM | POA: Insufficient documentation

## 2013-08-20 DIAGNOSIS — F17200 Nicotine dependence, unspecified, uncomplicated: Secondary | ICD-10-CM | POA: Insufficient documentation

## 2013-08-20 DIAGNOSIS — X500XXA Overexertion from strenuous movement or load, initial encounter: Secondary | ICD-10-CM | POA: Insufficient documentation

## 2013-08-20 DIAGNOSIS — IMO0001 Reserved for inherently not codable concepts without codable children: Secondary | ICD-10-CM | POA: Insufficient documentation

## 2013-08-20 DIAGNOSIS — Y939 Activity, unspecified: Secondary | ICD-10-CM | POA: Insufficient documentation

## 2013-08-20 DIAGNOSIS — Y929 Unspecified place or not applicable: Secondary | ICD-10-CM | POA: Insufficient documentation

## 2013-08-20 DIAGNOSIS — Z79899 Other long term (current) drug therapy: Secondary | ICD-10-CM | POA: Insufficient documentation

## 2013-08-20 DIAGNOSIS — S39012A Strain of muscle, fascia and tendon of lower back, initial encounter: Secondary | ICD-10-CM

## 2013-08-20 MED ORDER — PREDNISONE 50 MG PO TABS: 50.0000 mg | ORAL_TABLET | Freq: Every day | ORAL | Status: DC

## 2013-08-20 MED ORDER — CYCLOBENZAPRINE HCL 10 MG PO TABS: 10.0000 mg | ORAL_TABLET | Freq: Three times a day (TID) | ORAL | Status: DC | PRN

## 2013-08-20 MED ORDER — HYDROCODONE-ACETAMINOPHEN 5-325 MG PO TABS
1.0000 | ORAL_TABLET | Freq: Once | ORAL | Status: AC
  Administered 2013-08-20: 1 via ORAL
  Filled 2013-08-20: qty 1

## 2013-08-20 MED ORDER — HYDROCODONE-ACETAMINOPHEN 5-325 MG PO TABS: 1.0000 | ORAL_TABLET | Freq: Four times a day (QID) | ORAL | Status: DC | PRN

## 2013-08-20 NOTE — ED Notes (Signed)
Pt reports lower back pain since Sunday. Unknown injury.

## 2013-08-20 NOTE — ED Provider Notes (Signed)
CSN: [id number]     Arrival date & time 08/20/13  1111 History  This chart was scribed for non-physician practitioner working with Dagmar HaitWilliam Blair Walden, MD by Ashley JacobsBrittany Andrews, ED scribe. This patient was seen in room TR05C/TR05C and the patient's care was started at 11:41 AM.  First MD Initiated Contact with Patient 08/20/13 1125     Chief Complaint  Patient presents with  . Back Pain   (Consider location/radiation/quality/duration/timing/severity/associated sxs/prior Treatment) The history is provided by the patient and medical records. No language interpreter was used.   HPI Comments: Brittney Price is a 38 y.o. female who presents to the Emergency Department complaining of constant, moderate, bilateral lumbar pain for the past three days. She describes the pain as pressure with intermittent sharp episodes that radiates upwardly. 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 Pt states movement makes the pain worse. She denies knowledge of any new injuries. Pt denies dysuria, fever, dysuria, frequency, weakness, fever, nausea, vomiting, abdominal pain, constipation, and diarrhea. She reports having a ongoing cough from prior flu symptoms. She does not have any known allergies to medications. Pt does not have any pertinent medical conditions. She denies recent immunizations. Pt works at [employer] and lift heavy objects however she states that she "lifts with her legs".  History reviewed. No pertinent past medical history. Past Surgical History  Procedure Laterality Date  . Ankle surgery     No family history on file. History  Substance Use Topics  . Smoking status: Current Every Day Smoker -- 0.50 packs/day  . Smokeless tobacco: Never Used  . Alcohol Use: Yes     Comment: occasional   OB History   Grav Para Term Preterm Abortions TAB SAB Ect Mult Living                 Review of Systems  Constitutional: Negative for fever.  HENT: Negative for congestion and  rhinorrhea.   Respiratory: Positive for cough.   Gastrointestinal: Negative for nausea, vomiting, abdominal pain, diarrhea and constipation.  Genitourinary: Negative for dysuria and frequency.  Musculoskeletal: Positive for arthralgias, back pain and myalgias.  Skin: Negative for color change.  Neurological: Negative for weakness.  All other systems reviewed and are negative.    Allergies  Review of patient's allergies indicates no known allergies.  Home Medications   Current Outpatient Rx  Name  Route  Sig  Dispense  Refill  . Guaifenesin 1200 MG TB12   Oral   Take 1 tablet (1,200 mg total) by mouth 2 (two) times daily.   20 each   0   . ibuprofen (ADVIL,MOTRIN) 200 MG tablet   Oral   Take 400 mg by mouth every 6 (six) hours as needed.         Marland Kitchen. ibuprofen (ADVIL,MOTRIN) 800 MG tablet   Oral   Take 1 tablet (800 mg total) by mouth every 8 (eight) hours as needed.   21 tablet   0   . promethazine-dextromethorphan (PROMETHAZINE-DM) 6.25-15 MG/5ML syrup   Oral   Take 5 mLs by mouth 4 (four) times daily as needed for cough.   120 mL   0   . Pseudoeph-Doxylamine-DM-APAP (NYQUIL PO)   Oral   Take 1 capsule by mouth at bedtime as needed (congestion).         . Pseudoephedrine-APAP-DM (DAYQUIL PO)   Oral   Take 1 capsule by mouth daily as needed (congestion).  BP 110/66  Pulse 62  Temp(Src) 98.1 F (36.7 C) (Oral)  Resp 17  Wt 160 lb (72.576 kg)  SpO2 97%  LMP 08/05/2013 Physical Exam  Nursing note and vitals reviewed. Constitutional: She is oriented to person, place, and time. She appears well-developed and well-nourished. No distress.  HENT:  Head: Normocephalic and atraumatic.  Eyes: EOM are normal. Pupils are equal, round, and reactive to light.  Neck: Normal range of motion. Neck supple. No tracheal deviation present.  Cardiovascular: Normal rate.   Pulmonary/Chest: Effort normal. No respiratory distress.  Abdominal: Soft. She exhibits no  distension.  Musculoskeletal: Normal range of motion.  Neurological: She is alert and oriented to person, place, and time.  Skin: Skin is warm and dry.  Psychiatric: She has a normal mood and affect. Her behavior is normal.    ED Course  Procedures (including critical care time) DIAGNOSTIC STUDIES: Oxygen Saturation is 97% on room air, normal by my interpretation.    COORDINATION OF CARE:  11:51 AM Discussed course of care with pt which. Pt understands and agrees.   Patient retreated for lower lumbar strain.  Told to return here as needed.  She has normal reflexes and neurological function her lower extremities  Carlyle Dolly, PA-C 08/20/13 1205

## 2013-08-20 NOTE — Discharge Instructions (Signed)
Return here as needed.  Followup with Dr. provided ice and heat. to your lower back

## 2013-08-20 NOTE — ED Provider Notes (Signed)
Medical screening examination/treatment/procedure(s) were performed by non-physician practitioner and as supervising physician I was immediately available for consultation/collaboration.  EKG Interpretation   None         Dagmar HaitWilliam Evangelos Paulino, MD 08/20/13 1544

## 2013-08-20 NOTE — ED Provider Notes (Signed)
Medical screening examination/treatment/procedure(s) were performed by non-physician practitioner and as supervising physician I was immediately available for consultation/collaboration.  EKG Interpretation   None         Derwood KaplanAnkit Shacarra Choe, MD 08/20/13 0136

## 2013-10-10 ENCOUNTER — Encounter (HOSPITAL_COMMUNITY): Payer: Self-pay | Admitting: Emergency Medicine

## 2013-10-10 ENCOUNTER — Emergency Department (HOSPITAL_COMMUNITY): Payer: Self-pay

## 2013-10-10 ENCOUNTER — Emergency Department (HOSPITAL_COMMUNITY)
Admission: EM | Admit: 2013-10-10 | Discharge: 2013-10-10 | Disposition: A | Discharge status: Home / Self Care | Payer: Self-pay | Attending: Emergency Medicine | Admitting: Emergency Medicine

## 2013-10-10 DIAGNOSIS — R21 Rash and other nonspecific skin eruption: Secondary | ICD-10-CM | POA: Insufficient documentation

## 2013-10-10 DIAGNOSIS — J069 Acute upper respiratory infection, unspecified: Secondary | ICD-10-CM | POA: Insufficient documentation

## 2013-10-10 DIAGNOSIS — F17200 Nicotine dependence, unspecified, uncomplicated: Secondary | ICD-10-CM | POA: Insufficient documentation

## 2013-10-10 DIAGNOSIS — B9789 Other viral agents as the cause of diseases classified elsewhere: Secondary | ICD-10-CM | POA: Insufficient documentation

## 2013-10-10 DIAGNOSIS — B349 Viral infection, unspecified: Secondary | ICD-10-CM

## 2013-10-10 LAB — RAPID STREP SCREEN (MED CTR MEBANE ONLY): Streptococcus, Group A Screen (Direct): NEGATIVE

## 2013-10-10 NOTE — Discharge Instructions (Signed)
Please call your doctor for a followup appointment within 24-48 hours. When you talk to your doctor please let them know that you were seen in the emergency department and have them acquire all of your records so that they can discuss the findings with you and formulate a treatment plan to fully care for your new and ongoing problems. Please call for an appointment to be reassessed Please rest and stay hydrated Please drink plenty of water Please try to avoid smoking for the next couple of days Please take Tylenol or ibuprofen as needed for discomfort-no more than 4000 mg/4 g of Tylenol per day, no more than 1200 mg of ibuprofen per day Please continue to monitor symptoms closely and if symptoms are to worsen or change (fever greater than 101, chills, chest pain, shortness of breath, difficulty breathing, nausea, vomiting, diarrhea, throat closing sensation, numbness, tingling, visual changes) please report back to the ED immediately  Upper Respiratory Infection, Adult An upper respiratory infection (URI) is also known as the common cold. It is often caused by a type of germ (virus). Colds are easily spread (contagious). You can pass it to others by kissing, coughing, sneezing, or drinking out of the same glass. Usually, you get better in 1 or 2 weeks.  HOME CARE   Only take medicine as told by your doctor.  Use a warm mist humidifier or breathe in steam from a hot shower.  Drink enough water and fluids to keep your pee (urine) clear or pale yellow.  Get plenty of rest.  Return to work when your temperature is back to normal or as told by your doctor. You may use a face mask and wash your hands to stop your cold from spreading. GET HELP RIGHT AWAY IF:   After the first few days, you feel you are getting worse.  You have questions about your medicine.  You have chills, shortness of breath, or brown or red spit (mucus).  You have yellow or brown snot (nasal discharge) or pain in the face,  especially when you bend forward.  You have a fever, puffy (swollen) neck, pain when you swallow, or white spots in the back of your throat.  You have a bad headache, ear pain, sinus pain, or chest pain.  You have a high-pitched whistling sound when you breathe in and out (wheezing).  You have a lasting cough or cough up blood.  You have sore muscles or a stiff neck. MAKE SURE YOU:   Understand these instructions.  Will watch your condition.  Will get help right away if you are not doing well or get worse. Document Released: 01/11/2008 Document Revised: 10/17/2011 Document Reviewed: 11/29/2010 [facility] Patient Information 2014 [place], [place].   Emergency Department Resource Guide 1) Find a Doctor and Pay Out of Pocket Although you won't have to find out who is covered by your insurance plan, it is a good idea to ask around and get recommendations. You will then need to call the office and see if the doctor you have chosen will accept you as a new patient and what types of options they offer for patients who are self-pay. Some doctors offer discounts or will set up payment plans for their patients who do not have insurance, but you will need to ask so you aren't surprised when you get to your appointment.  2) Contact Your Local Health Department Not all health departments have doctors that can see patients for sick visits, but many do, so it is  worth a call to see if yours does. If you don't know where your local health department is, you can check in your phone book. The CDC also has a tool to help you locate your state's health department, and many state websites also have listings of all of their local health departments.  3) Find a Walk-in Clinic If your illness is not likely to be very severe or complicated, you may want to try a walk in clinic. These are popping up all over the country in pharmacies, drugstores, and shopping centers. They're usually staffed by nurse practitioners  or physician assistants that have been trained to treat common illnesses and complaints. They're usually fairly quick and inexpensive. However, if you have serious medical issues or chronic medical problems, these are probably not your best option.  No Primary Care Doctor: - Call Health Connect at  [phone] - they can help you locate a primary care doctor that  accepts your insurance, provides certain services, etc. - Physician Referral Service- [phone]  Chronic Pain Problems: Organization         Address  Phone   Notes  [facility]  [phone] Patients need to be referred by their primary care doctor.   Medication Assistance: Organization         Address  Phone   Notes  [facility] Medication [facility] [address] [place], [place] [address] [phone]) [phone] --Must be a resident of [place] [place] -- Must have NO insurance coverage whatsoever (no Medicaid/ Medicare, etc.) -- The pt. MUST have a primary care doctor that directs their care regularly and follows them in the community   MedAssist  [phone]   [employer]  [phone]    Agencies that provide inexpensive medical care: Organization         Address  Phone   Notes  [facility]  [phone]   [facility]    [phone]   [facility] [address] [place], [place] [address] [phone]   [facility] [address], [place] [phone](336) [phone]   [facility]anned Parenthood    [phone]) [phone]   [facility]    [phone]   [facility]  [address], [place] Phone:  [phone], Fax:  [phone] Hours of Operation:  9 am - 6 pm, M-F.  Also accepts Medicaid/Medicare and self-pay.  [facility]  [address], [place] Phone: [phone], Fax: [phone]. Hours of Operation:  8:30 am - 5:30 pm, M-F.   Also accepts Medicaid and self-pay.  [facility] [place] [address], [place] Phone: [phone]) [phone]   [facility] [address], [place], [place] [phone], Ext. [phone] Mondays & Thursdays: 7-9 AM.  First 15 patients are seen on a first come, first serve basis.    Medicaid-accepting [facility] Providers:  Organization         Address  Phone   Notes  [facility] [address], [place] [phone]) [phone] Also accepts self-pay patients.  [facility] [address], [place]  [phone]) [phone]   [facility] [address], [place] [phone]) [phone]   [facility] [address] [place] [address], [place] [phone]   Renaye RakersVeita Bland [address]  [address], [place]   [phone] Only accepts [place] [employer] patients after they have their name applied to their card.   Self-Pay (no insurance) in [facility]:  Organization         Address  Phone   Notes  Sickle Cell Patients, [facility] [address] [place], [place] [phone]   [facility] [address] [place], [place] [phone]   [facility] [place]  [address], [place] [phone]   [facility]/Dr. Osei-Bonsu  [address] [place] or [address], [place] [phone] Phone number for both [place] and [place] locations is the same.  [facility] [address], [place] [phone]   [facility] [address], [place] or [address] [phone]   [facility] [address], [place] [phone], phone; [phone], fax Sees patients 1st and 3rd Saturday of every month.  Must not qualify for public or private insurance (i.e. Medicaid, Medicare, [place] Health Choice, Veterans'  Benefits)  Household income should be no more than 200% of the poverty level The clinic cannot treat you if you are pregnant or think you are pregnant  Sexually transmitted diseases are not treated at the clinic.    Dental Care: Organization         Address  Phone  Notes  [facility] Department of [facility] [address] [place], [place] [phone] Accepts children up to age 69 who are enrolled in [place] or [place] Health Choice; pregnant women with a Medicaid card; and children who have applied for Medicaid or [place] Health Choice, but were declined, whose parents can pay a reduced fee at time of service.  [facility] of [facility]  [address], [place] [phone] Accepts children up to age 57 who are enrolled in [place] or [place] Health Choice; pregnant women with a Medicaid card; and children who have applied for Medicaid or [place] Health Choice, but were declined, whose parents can pay a reduced fee at time of service.  [facility]  [address] [place], [place] [phone] Patients are seen by appointment only. Walk-ins are not accepted. [facility] will see patients 37 years of age and older. Monday - Tuesday (8am-5pm) Most Wednesdays (8:30-5pm) $30 per visit, cash only  [facility] Adult [facility] Access PRO[facility]  [address], [facility] [phone] Patients are seen by appointment only. Walk-ins are not accepted. [facility] will see patients 1 years of age and older. One Wednesday Evening (Monthly: Volunteer Based).  $30 per visit, cash only  [employer]  [phone] for adults; Children under age 11, call Graduate Pediatric Dentistry at [phone]. Children aged 54-14, please call [phone] to request a pediatric application.  Dental services are provided in all areas of dental care including fillings, crowns and bridges, complete and partial  dentures, implants, gum treatment, root canals, and extractions. Preventive care is also provided. Treatment is provided to both adults and children. Patients are selected via a lottery and there is often a waiting list.   [facility] [address], [place]  [phone] [email or web address]   [facility] [address] [place], [place] [phone], Ext. [phone] Second and Fourth Thursday of  each month, opens at 6:30 AM; Clinic ends at 9 AM.  Patients are seen on a first-come first-served basis, and a limited number are seen during each clinic.   [facility]  [address], [place], [place] [phone]   Eligibility Requirements You must have lived in [place], [place], or [place] for at least the last three months.   You cannot be eligible for state or federal sponsored National Cityhealthcare insurance, including CIGNAVeterans Administration, [place]icaid, or [employer]Medicare.   You generally cannot be eligible for healthcare insurance through your employer.    How to apply: Eligibility screenings are held every Tuesday and Wednesday afternoon from 1:00 pm until 4:00 pm. You do not need an appointment for the interview!  [facility] [address], [place], [place] [phone]   [facility]  [phone]   [facility]  [phone]   [facility]  [phone]    Behavioral Health Resources in the Community: Intensive Outpatient Programs Organization         Address  Phone  Notes  [facility] [address], [place], [place] [phone]   [facility] [address], [place], [place] [phone]   [facility] [address], [place], [place]  [phone]   [facility] [address],  [place], [place] [phone] or [phone]   Substance Abuse Resources Organization          Address  Phone  Notes  [facility] Drug [facility]  [phone]   [facility]  [phone]   [facility]  [phone]   [facility]  [phone]   Residential & Outpatient Substance Abuse Program  [phone]   Psychological Services Organization         Address  Phone  Notes  [facility]  [phone]- [phone]   [facility]  [phone]- [phone]   [facility] [address], [place] [phone] or [phone]    Mobile Crisis Teams Organization         Address  Phone  Notes  [facility], Mobile Crisis Care Unit  [phone]   [facility]  [address] [place], [place] [phone]   Doristine LocksSharon DeEsch [address] [place] [phone]    Self-Help/Support Groups Organization         Address  Phone             Notes  [facility] of [place] - variety of support groups  [phone] Call for more information  Narcotics Anonymous (NA), [facility]aring [facility] [address], [place]  2 meetings at this location   Statisticianesidential Treatment Programs Organization         Address  Phone  Notes  [facility] [address],    [place]  [phone]   [facility]  [address], [place] [phone], [place], [place] [phone]   [facility] [address], [place] [phone] Admissions: 8am-3pm M-F  [facility] [address]    [place], [place] [phone]   [facility] [address], [place], [place] [phone]   [facility] [address]  [place], [place] [phone]   [facility] - Intensive Outpatient [address], [place], [place] [phone]   [facility] ([facility]iction Recovery [facility] [address]  [place],  [place] [phone] or [phone]   [facility] (RT[facility]) [address] [place], [place]  [phone] Accepts Medicaid  [facility] [address]  [place] [phone] Substance Abuse/Addiction Treatment   [facility] Behavioral Health Resources Organization         Address  Phone  Notes  [facility]  [phone]) [phone]   Angie FavaJulie Brannon, PhD [address], [address] [place], [place]   [phone](336) [phone] or [phone]   [facility] [place]   [address] [place], [place] [phone]) [phone]   [facility] [address], [place], [place] [phone]) [phone] Insurance/Medicaid/sponsorship through [facility]  [facility] Families [address]                                    [place], [place] [phone]) [phone] Therapy/tele-psych/case  [facility] [address] Falls.   [place], [place] [phone]) [phone]    Dr. Lolly MustacheArfeen  [phone]) [phone]   [facility] [place]  [employer] [facility] 1) [address], [place] 2) [address], [place] 3)  [address], [place] [phone]) [phone](336) [phone]  [phone](336) [phone]   [facility] Child Abuse Hotline [phone]) [phone] or [phone](336) [phone] (After Hours)

## 2013-10-10 NOTE — ED Provider Notes (Signed)
CSN: [id number]     Arrival date & time 10/10/13  0902 History  This chart was scribed for non-physician practitioner, Raymon Mutton, PA-C working with Joya Gaskins, MD by Greggory Stallion, ED scribe. This patient was seen in room TR08C/TR08C and the patient's care was started at 10:03 AM.    Chief Complaint  Patient presents with  . Sore Throat   The history is provided by the patient. No language interpreter was used.   HPI Comments: Brittney Price is a 38 y.o. female who presents to the Emergency Department complaining of gradual onset, constant sore throat and generalized body aches that started this morning. Pt is also having congestion and mild nonproductive cough. She has taken ibuprofen with little relief. Pt's daughter is sick with similar symptoms. She states she has an itchy rash to the back of her neck. Denies fever, blurred vision, sudden loss of vision. chest pain, SOB, difficulty breathing, abdominal pain, nausea, emesis, diarrhea, difficulty urinating, dizziness, headaches. Pt smokes cigarettes.   History reviewed. No pertinent past medical history. Past Surgical History  Procedure Laterality Date  . Ankle surgery     No family history on file. History  Substance Use Topics  . Smoking status: Current Every Day Smoker -- 0.50 packs/day  . Smokeless tobacco: Never Used  . Alcohol Use: Yes     Comment: occasional   OB History   Grav Para Term Preterm Abortions TAB SAB Ect Mult Living                 Review of Systems  Constitutional: Negative for fever.  HENT: Positive for congestion and sore throat.   Eyes: Negative for visual disturbance.  Respiratory: Positive for cough. Negative for shortness of breath.   Cardiovascular: Negative for chest pain.  Gastrointestinal: Negative for nausea, vomiting, abdominal pain and diarrhea.  Genitourinary: Negative for difficulty urinating.  Musculoskeletal: Positive for myalgias.  Skin: Positive for rash.  Neurological:  Negative for dizziness and headaches.  All other systems reviewed and are negative.   Allergies  Review of patient's allergies indicates no known allergies.  Home Medications   Current Outpatient Rx  Name  Route  Sig  Dispense  Refill  . ibuprofen (ADVIL,MOTRIN) 200 MG tablet   Oral   Take 600 mg by mouth every 6 (six) hours as needed for fever, headache or moderate pain.           BP 106/63  Pulse 75  Temp(Src) 98.7 F (37.1 C) (Oral)  Resp 18  SpO2 97%  LMP 10/10/2013  Physical Exam  Nursing note and vitals reviewed. Constitutional: She is oriented to person, place, and time. She appears well-developed and well-nourished. No distress.  HENT:  Head: Normocephalic and atraumatic.  Mouth/Throat: Posterior oropharyngeal erythema present. No oropharyngeal exudate or posterior oropharyngeal edema.  Mild erythema noted to the posterior oropharynx-negative swelling, lesions, sores, exudate, petechiae noted. Negative tonsillar exudate identified. Negative tonsillar adenopathy. Negative pre-or postauricular adenopathy.  Eyes: Conjunctivae and EOM are normal. Pupils are equal, round, and reactive to light. Right eye exhibits no discharge. Left eye exhibits no discharge.  Neck: Normal range of motion. Neck supple. No tracheal deviation present.  Negative neck stiffness Negative nuchal rigidity Negative cervical lymphadenopathy  Appears to be a dermatitis localized to the posterior aspect of the patient's neck. Blanchable. Negative urticarial appearance.  Cardiovascular: Normal rate, regular rhythm and normal heart sounds.   Pulses:      Radial pulses are 2+ on the right  side, and 2+ on the left side.  Pulmonary/Chest: Effort normal and breath sounds normal. No respiratory distress. She has no wheezes. She has no rhonchi. She has no rales.  Musculoskeletal: Normal range of motion.  Full ROM to upper and lower extremities without difficulty noted, negative ataxia noted.   Lymphadenopathy:    She has no cervical adenopathy.  Neurological: She is alert and oriented to person, place, and time. No cranial nerve deficit. She exhibits normal muscle tone. Coordination normal.  Cranial nerves III-XII grossly intact Strength 5+/5+ to upper extremities bilaterally with resistance applied, equal distribution noted  Skin: Skin is warm and dry. No rash noted. No erythema.  Psychiatric: She has a normal mood and affect. Her behavior is normal.    ED Course  Procedures (including critical care time)  DIAGNOSTIC STUDIES: Oxygen Saturation is 97% on RA, normal by my interpretation.    COORDINATION OF CARE: 10:07 AM-Discussed treatment plan which includes strep culture with pt at bedside and pt agreed to plan.   Results for orders placed during the hospital encounter of 10/10/13  RAPID STREP SCREEN      Result Value Ref Range   Streptococcus, Group A Screen (Direct) NEGATIVE  NEGATIVE   Dg Chest 2 View  10/10/2013   CLINICAL DATA:  Cough, sore throat  EXAM: CHEST  2 VIEW  COMPARISON:  DG CHEST 2 VIEW dated 08/14/2013  FINDINGS: The heart size and mediastinal contours are within normal limits. Both lungs are clear. The visualized skeletal structures are unremarkable.  IMPRESSION: No active cardiopulmonary disease.   Electronically Signed   By: Elige KoHetal  Patel   On: 10/10/2013 10:47    Labs Review Labs Reviewed  RAPID STREP SCREEN  CULTURE, GROUP A STREP   Imaging Review Dg Chest 2 View  10/10/2013   CLINICAL DATA:  Cough, sore throat  EXAM: CHEST  2 VIEW  COMPARISON:  DG CHEST 2 VIEW dated 08/14/2013  FINDINGS: The heart size and mediastinal contours are within normal limits. Both lungs are clear. The visualized skeletal structures are unremarkable.  IMPRESSION: No active cardiopulmonary disease.   Electronically Signed   By: Elige KoHetal  Patel   On: 10/10/2013 10:47     EKG Interpretation None      MDM   Final diagnoses:  URI (upper respiratory infection)  Viral  syndrome    Filed Vitals:   10/10/13 0913  BP: 106/63  Pulse: 75  Temp: 98.7 F (37.1 C)  TempSrc: Oral  Resp: 18  SpO2: 97%   I personally performed the services described in this documentation, which was scribed in my presence. The recorded information has been reviewed and is accurate.  Patient presenting to the ED with dry cough, nasal congestion, generalized bodyaches, mild sore throat that started this morning. Stated that she use ibuprofen with minimal relief. Reported that daughter is presenting with similar symptoms. Patient is a smoker. Alert and oriented. GCS 15. Heart rate and rhythm normal. Radial pulses 2+ bilaterally. Cap refill less than 3 seconds. Lungs clear to auscultation to upper and lower lobes bilaterally. Negative nuchal rigidity, neck stiffness-negative cervical lymphadenopathy-negative meningeal signs. Negative pre-or postauricular swelling. Unremarkable oral exam-negative swelling, exudate, lesions, sores noted to the posterior oropharynx or tonsils. Negative petechiae. Mild erythema identified to the posterior oropharynx. Negative trismus. Appears to be a dermatitis rash appearing posterior aspect of the patient's neck-blanchable-negative pain upon palpation. Strength intact upper extremities bilaterally without difficulty-equal distribution to equal grip strength. Rapid strep test negative. Chest x-ray negative  acute cardiac pulmonary disease. Gastric vocal pharyngitis. Doubt pneumonia. Suspicion to be upper respiratory infection, viral in nature-viral syndrome. Patient stable, afebrile. Discharged patient. Discussed with patient to rest and stay hydrated. Discussed with patient supportive therapy. Discussed with patient to drink plenty of fluids. Recommended hydrocortisone cream that is sold over the counter. Discussed with patient to closely monitor symptoms and if symptoms are to worsen or change to report back to the ED - strict return instructions given.  Patient  agreed to plan of care, understood, all questions answered.   Raymon Mutton, PA-C 10/11/13 1305

## 2013-10-10 NOTE — ED Notes (Signed)
Started this am with sore throat and body aches.

## 2013-10-12 LAB — CULTURE, GROUP A STREP

## 2013-10-12 NOTE — ED Provider Notes (Signed)
Medical screening examination/treatment/procedure(s) were performed by non-physician practitioner and as supervising physician I was immediately available for consultation/collaboration.   EKG Interpretation None        Joya Gaskinsonald W Nadir Vasques, MD 10/12/13 1945

## 2014-01-10 ENCOUNTER — Encounter (HOSPITAL_COMMUNITY): Payer: Self-pay | Admitting: Emergency Medicine

## 2014-01-10 ENCOUNTER — Emergency Department (HOSPITAL_COMMUNITY)
Admission: EM | Admit: 2014-01-10 | Discharge: 2014-01-11 | Disposition: A | Discharge status: Home / Self Care | Payer: Self-pay | Attending: Emergency Medicine | Admitting: Emergency Medicine

## 2014-01-10 DIAGNOSIS — R5381 Other malaise: Secondary | ICD-10-CM | POA: Insufficient documentation

## 2014-01-10 DIAGNOSIS — R5383 Other fatigue: Secondary | ICD-10-CM

## 2014-01-10 DIAGNOSIS — F17200 Nicotine dependence, unspecified, uncomplicated: Secondary | ICD-10-CM | POA: Insufficient documentation

## 2014-01-10 DIAGNOSIS — R112 Nausea with vomiting, unspecified: Secondary | ICD-10-CM | POA: Insufficient documentation

## 2014-01-10 DIAGNOSIS — R231 Pallor: Secondary | ICD-10-CM | POA: Insufficient documentation

## 2014-01-10 DIAGNOSIS — R6883 Chills (without fever): Secondary | ICD-10-CM | POA: Insufficient documentation

## 2014-01-10 LAB — I-STAT CHEM 8, ED
BUN: 8 mg/dL (ref 6–23)
CALCIUM ION: 1.13 mmol/L (ref 1.12–1.23)
CREATININE: 0.8 mg/dL (ref 0.50–1.10)
Chloride: 104 mEq/L (ref 96–112)
Glucose, Bld: 128 mg/dL — ABNORMAL HIGH (ref 70–99)
HCT: 41 % (ref 36.0–46.0)
Hemoglobin: 13.9 g/dL (ref 12.0–15.0)
Potassium: 3.3 mEq/L — ABNORMAL LOW (ref 3.7–5.3)
Sodium: 140 mEq/L (ref 137–147)
TCO2: 25 mmol/L (ref 0–100)

## 2014-01-10 LAB — CBC WITH DIFFERENTIAL/PLATELET
Basophils Absolute: 0 10*3/uL (ref 0.0–0.1)
Basophils Relative: 0 % (ref 0–1)
EOS PCT: 2 % (ref 0–5)
Eosinophils Absolute: 0.2 10*3/uL (ref 0.0–0.7)
HEMATOCRIT: 40.6 % (ref 36.0–46.0)
HEMOGLOBIN: 13.7 g/dL (ref 12.0–15.0)
LYMPHS ABS: 2.8 10*3/uL (ref 0.7–4.0)
LYMPHS PCT: 29 % (ref 12–46)
MCH: 31.7 pg (ref 26.0–34.0)
MCHC: 33.7 g/dL (ref 30.0–36.0)
MCV: 94 fL (ref 78.0–100.0)
MONO ABS: 0.8 10*3/uL (ref 0.1–1.0)
MONOS PCT: 8 % (ref 3–12)
NEUTROS ABS: 6 10*3/uL (ref 1.7–7.7)
Neutrophils Relative %: 61 % (ref 43–77)
Platelets: 169 10*3/uL (ref 150–400)
RBC: 4.32 MIL/uL (ref 3.87–5.11)
RDW: 12 % (ref 11.5–15.5)
WBC: 9.8 10*3/uL (ref 4.0–10.5)

## 2014-01-10 NOTE — ED Notes (Signed)
Patient states she has been having nausea and vomiting for the last few days.  Patient states she has been feeling weak, with chills.  Patient denies any fever.

## 2014-01-11 MED ORDER — ONDANSETRON HCL 4 MG/2ML IJ SOLN
4.0000 mg | Freq: Once | INTRAMUSCULAR | Status: AC
  Administered 2014-01-11: 4 mg via INTRAVENOUS
  Filled 2014-01-11: qty 2

## 2014-01-11 MED ORDER — SODIUM CHLORIDE 0.9 % IV BOLUS (SEPSIS)
1000.0000 mL | Freq: Once | INTRAVENOUS | Status: AC
  Administered 2014-01-11: 1000 mL via INTRAVENOUS

## 2014-01-11 MED ORDER — ONDANSETRON 4 MG PO TBDP: 4.0000 mg | ORAL_TABLET | Freq: Three times a day (TID) | ORAL | Status: DC | PRN

## 2014-01-11 NOTE — ED Provider Notes (Signed)
Medical screening examination/treatment/procedure(s) were performed by non-physician practitioner and as supervising physician I was immediately available for consultation/collaboration.   EKG Interpretation None       Vallarie Fei M Litha Lamartina, MD 01/11/14 0751 

## 2014-01-11 NOTE — Discharge Instructions (Signed)
Nausea and Vomiting Nausea is a sick feeling that often comes before throwing up (vomiting). Vomiting is a reflex where stomach contents come out of your mouth. Vomiting can cause severe loss of body fluids (dehydration). Children and elderly adults can become dehydrated quickly, especially if they also have diarrhea. Nausea and vomiting are symptoms of a condition or disease. It is important to find the cause of your symptoms. CAUSES   Direct irritation of the stomach lining. This irritation can result from increased acid production (gastroesophageal reflux disease), infection, food poisoning, taking certain medicines (such as nonsteroidal anti-inflammatory drugs), alcohol use, or tobacco use.  Signals from the brain.These signals could be caused by a headache, heat exposure, an inner ear disturbance, increased pressure in the brain from injury, infection, a tumor, or a concussion, pain, emotional stimulus, or metabolic problems.  An obstruction in the gastrointestinal tract (bowel obstruction).  Illnesses such as diabetes, hepatitis, gallbladder problems, appendicitis, kidney problems, cancer, sepsis, atypical symptoms of a heart attack, or eating disorders.  Medical treatments such as chemotherapy and radiation.  Receiving medicine that makes you sleep (general anesthetic) during surgery. DIAGNOSIS Your caregiver may ask for tests to be done if the problems do not improve after a few days. Tests may also be done if symptoms are severe or if the reason for the nausea and vomiting is not clear. Tests may include:  Urine tests.  Blood tests.  Stool tests.  Cultures (to look for evidence of infection).  X-rays or other imaging studies. Test results can help your caregiver make decisions about treatment or the need for additional tests. TREATMENT You need to stay well hydrated. Drink frequently but in small amounts.You may wish to drink water, sports drinks, clear broth, or eat frozen  ice pops or gelatin dessert to help stay hydrated.When you eat, eating slowly may help prevent nausea.There are also some antinausea medicines that may help prevent nausea. HOME CARE INSTRUCTIONS   Take all medicine as directed by your caregiver.  If you do not have an appetite, do not force yourself to eat. However, you must continue to drink fluids.  If you have an appetite, eat a normal diet unless your caregiver tells you differently.  Eat a variety of complex carbohydrates (rice, wheat, potatoes, bread), lean meats, yogurt, fruits, and vegetables.  Avoid high-fat foods because they are more difficult to digest.  Drink enough water and fluids to keep your urine clear or pale yellow.  If you are dehydrated, ask your caregiver for specific rehydration instructions. Signs of dehydration may include:  Severe thirst.  Dry lips and mouth.  Dizziness.  Dark urine.  Decreasing urine frequency and amount.  Confusion.  Rapid breathing or pulse. SEEK IMMEDIATE MEDICAL CARE IF:   You have blood or brown flecks (like coffee grounds) in your vomit.  You have black or bloody stools.  You have a severe headache or stiff neck.  You are confused.  You have severe abdominal pain.  You have chest pain or trouble breathing.  You do not urinate at least once every 8 hours.  You develop cold or clammy skin.  You continue to vomit for longer than 24 to 48 hours.  You have a fever. MAKE SURE YOU:   Understand these instructions.  Will watch your condition.  Will get help right away if you are not doing well or get worse. Document Released: 07/25/2005 Document Revised: 10/17/2011 Document Reviewed: 12/22/2010 [facility] Patient Information 2014 [place], [place]. Use the Zofran as needed  for any additional episodes of nausea and vomiting

## 2014-01-11 NOTE — ED Provider Notes (Signed)
CSN: [id number]     Arrival date & time 01/10/14  2013 History   First MD Initiated Contact with Patient 01/11/14 0002     Chief Complaint  Patient presents with  . Emesis     (Consider location/radiation/quality/duration/timing/severity/associated sxs/prior Treatment) HPI Comments: Patient reports, 2 days of intermittent episodes of nausea and vomiting.  Denies diarrhea, fever, she, states she is feeling, weak, and has occasional chills.  Has not tried taking any medication.  She states, approximately 30 minutes after she sits on fluids.  She will vomit it back.  No previous history of same  Patient is a 38 y.o. female presenting with vomiting. The history is provided by the patient.  Emesis Severity:  Mild Quality:  Bilious material Able to tolerate:  Liquids How soon after eating does vomiting occur:  30 minutes Progression:  Unchanged Chronicity:  New Recent urination:  Normal Relieved by:  None tried Worsened by:  Liquids Associated symptoms: chills   Associated symptoms: no abdominal pain, no diarrhea, no fever, no sore throat and no URI   Risk factors: no diabetes and not pregnant now     History reviewed. No pertinent past medical history. Past Surgical History  Procedure Laterality Date  . Ankle surgery     No family history on file. History  Substance Use Topics  . Smoking status: Current Every Day Smoker -- 0.50 packs/day    Types: Cigarettes  . Smokeless tobacco: Never Used  . Alcohol Use: Yes     Comment: occasional   OB History   Grav Para Term Preterm Abortions TAB SAB Ect Mult Living                 Review of Systems  Constitutional: Positive for chills. Negative for fever.  HENT: Negative for sore throat.   Respiratory: Negative for shortness of breath.   Gastrointestinal: Positive for nausea and vomiting. Negative for abdominal pain, diarrhea and constipation.  Genitourinary: Negative for dysuria.  Skin: Negative for wound.  Neurological:  Positive for weakness.  All other systems reviewed and are negative.     Allergies  Review of patient's allergies indicates no known allergies.  Home Medications   Prior to Admission medications   Medication Sig Start Date End Date Taking? Authorizing Provider  ibuprofen (ADVIL,MOTRIN) 200 MG tablet Take 600 mg by mouth every 6 (six) hours as needed for fever, headache or moderate pain.    Yes Historical Provider, MD  ondansetron (ZOFRAN ODT) 4 MG disintegrating tablet Take 1 tablet (4 mg total) by mouth every 8 (eight) hours as needed for nausea or vomiting. 01/11/14   Arman FilterGail K Princesa Willig, NP   BP 108/51  Pulse 58  Temp(Src) 98.5 F (36.9 C) (Oral)  Resp 18  SpO2 99%  LMP 12/27/2013 Physical Exam  Vitals reviewed. Constitutional: She is oriented to person, place, and time. She appears well-developed and well-nourished.  HENT:  Head: Normocephalic.  Right Ear: External ear normal.  Left Ear: External ear normal.  Mouth/Throat: Uvula is midline. Mucous membranes are dry.  Cardiovascular: Normal rate.   Pulmonary/Chest: Effort normal.  Abdominal: Soft. She exhibits no distension. There is no tenderness.  Lymphadenopathy:    She has no cervical adenopathy.  Neurological: She is alert and oriented to person, place, and time.  Skin: There is pallor.    ED Course  Procedures (including critical care time) Labs Review Labs Reviewed  I-STAT CHEM 8, ED - Abnormal; Notable for the following:    Potassium 3.3 (*)  Glucose, Bld 128 (*)    All other components within normal limits  CBC WITH DIFFERENTIAL    Imaging Review No results found.   EKG Interpretation None      MDM  Will hydrate provide antiemetic and reevaluate Final diagnoses:  Nausea & vomiting         Arman Filter, NP 01/11/14 0407

## 2014-09-18 ENCOUNTER — Emergency Department (HOSPITAL_COMMUNITY): Payer: Self-pay

## 2014-09-18 ENCOUNTER — Emergency Department (HOSPITAL_COMMUNITY)
Admission: EM | Admit: 2014-09-18 | Discharge: 2014-09-18 | Disposition: A | Discharge status: Home / Self Care | Payer: Self-pay | Attending: Emergency Medicine | Admitting: Emergency Medicine

## 2014-09-18 ENCOUNTER — Encounter (HOSPITAL_COMMUNITY): Payer: Self-pay | Admitting: Emergency Medicine

## 2014-09-18 DIAGNOSIS — J069 Acute upper respiratory infection, unspecified: Secondary | ICD-10-CM | POA: Insufficient documentation

## 2014-09-18 DIAGNOSIS — J209 Acute bronchitis, unspecified: Secondary | ICD-10-CM | POA: Insufficient documentation

## 2014-09-18 DIAGNOSIS — M940 Chondrocostal junction syndrome [Tietze]: Secondary | ICD-10-CM | POA: Insufficient documentation

## 2014-09-18 DIAGNOSIS — J40 Bronchitis, not specified as acute or chronic: Secondary | ICD-10-CM

## 2014-09-18 DIAGNOSIS — Z720 Tobacco use: Secondary | ICD-10-CM | POA: Insufficient documentation

## 2014-09-18 LAB — CBC WITH DIFFERENTIAL/PLATELET
BASOS ABS: 0 10*3/uL (ref 0.0–0.1)
BASOS PCT: 0 % (ref 0–1)
EOS PCT: 3 % (ref 0–5)
Eosinophils Absolute: 0.2 10*3/uL (ref 0.0–0.7)
HEMATOCRIT: 40.5 % (ref 36.0–46.0)
Hemoglobin: 13.7 g/dL (ref 12.0–15.0)
LYMPHS PCT: 25 % (ref 12–46)
Lymphs Abs: 1.8 10*3/uL (ref 0.7–4.0)
MCH: 31.4 pg (ref 26.0–34.0)
MCHC: 33.8 g/dL (ref 30.0–36.0)
MCV: 92.7 fL (ref 78.0–100.0)
Monocytes Absolute: 0.5 10*3/uL (ref 0.1–1.0)
Monocytes Relative: 6 % (ref 3–12)
Neutro Abs: 4.9 10*3/uL (ref 1.7–7.7)
Neutrophils Relative %: 66 % (ref 43–77)
Platelets: 127 10*3/uL — ABNORMAL LOW (ref 150–400)
RBC: 4.37 MIL/uL (ref 3.87–5.11)
RDW: 11.7 % (ref 11.5–15.5)
WBC: 7.3 10*3/uL (ref 4.0–10.5)

## 2014-09-18 LAB — BASIC METABOLIC PANEL
ANION GAP: 7 (ref 5–15)
BUN: 7 mg/dL (ref 6–23)
CALCIUM: 8.6 mg/dL (ref 8.4–10.5)
CHLORIDE: 107 mmol/L (ref 96–112)
CO2: 24 mmol/L (ref 19–32)
CREATININE: 0.68 mg/dL (ref 0.50–1.10)
GFR calc Af Amer: >90 mL/min (ref >90)
GFR calc non Af Amer: >90 mL/min (ref >90)
Glucose, Bld: 92 mg/dL (ref 70–99)
Potassium: 4.2 mmol/L (ref 3.5–5.1)
Sodium: 138 mmol/L (ref 135–145)

## 2014-09-18 LAB — I-STAT TROPONIN, ED: Troponin i, poc: 0 ng/mL (ref 0.00–0.08)

## 2014-09-18 MED ORDER — KETOROLAC TROMETHAMINE 30 MG/ML IJ SOLN
60.0000 mg | Freq: Once | INTRAMUSCULAR | Status: AC
  Administered 2014-09-18: 60 mg via INTRAMUSCULAR
  Filled 2014-09-18: qty 2

## 2014-09-18 MED ORDER — NAPROXEN 500 MG PO TABS: 500.0000 mg | ORAL_TABLET | Freq: Two times a day (BID) | ORAL | Status: DC | PRN

## 2014-09-18 NOTE — ED Provider Notes (Signed)
CSN: [id number]     Arrival date & time 09/18/14  1413 History   First MD Initiated Contact with Patient 09/18/14 1623     Chief Complaint  Patient presents with  . Cough  . Chest Pain     (Consider location/radiation/quality/duration/timing/severity/associated sxs/prior Treatment) HPI Comments: Brittney Price is a 39 y.o. Female smoker, who presents to the ED with complaints of cough x1.5 wks with green sputum production which is improving, associated with gradual onset anterior sternal chest pain that began ~14hours PTA. She states she was moving in bed and she felt the pain begin. States it's 9/10 constant aching pain in her sternum area, nonradiating, worse with movement and coughing, and with no known alleviating factors since she has not tried anything PTA. She states that she had a "cold" that began 1.5wks ago with the cough and rhinorrhea, and overall is improving, but the chest pain concerned her. She's never had chest pain before, and has no cardiac history. Denies radiation to neck, jaw, arms, or back. Denies fevers, chills, ear symptoms, sore throat, wheezing, DOE, SOB, diaphoresis, LE swelling, claudication, PND, orthopnea, hx of DVT/PE, family hx of cardiac disease or DVT/PE, estrogen use, recent travel/immobilization/surgery, anxiety, heartburn, abd pain, n/v/d/c, dysuria, hematuria, numbness, tingling, weakness, syncope, or lightheadedness. Denies sick contacts that she knows of. No PCP at this time, recently moved from [place] and hasn't had a chance to establish care yet. No hx of asthma or COPD.   Patient is a 39 y.o. female presenting with cough and chest pain. The history is provided by the patient. No language interpreter was used.  Cough Cough characteristics:  Productive Sputum characteristics:  Green Severity:  Mild Onset quality:  Gradual Duration:  2 weeks Timing:  Intermittent Progression:  Partially resolved Chronicity:  New Smoker: yes   Context: upper  respiratory infection   Relieved by:  None tried Worsened by:  Nothing tried Ineffective treatments:  None tried Associated symptoms: chest pain and rhinorrhea   Associated symptoms: no chills, no diaphoresis, no ear pain, no fever, no myalgias, no shortness of breath, no sore throat and no wheezing   Chest Pain Pain location:  Substernal area Pain quality: aching   Pain radiates to:  Does not radiate Pain radiates to the back: no   Pain severity:  Moderate Onset quality:  Gradual Duration:  14 hours Timing:  Constant Progression:  Unchanged Chronicity:  New Context: movement   Relieved by:  None tried Worsened by:  Coughing and movement Ineffective treatments:  None tried Associated symptoms: cough   Associated symptoms: no abdominal pain, no altered mental status, no anxiety, no back pain, no claudication, no diaphoresis, no dizziness, no dysphagia, no fever, no heartburn, no lower extremity edema, no nausea, no numbness, no orthopnea, no palpitations, no PND, no shortness of breath, no syncope, not vomiting and no weakness   Risk factors: smoking   Risk factors: no coronary artery disease, no high cholesterol, no hypertension, no immobilization and no prior DVT/PE     History reviewed. No pertinent past medical history. Past Surgical History  Procedure Laterality Date  . Ankle surgery     History reviewed. No pertinent family history. History  Substance Use Topics  . Smoking status: Current Every Day Smoker -- 0.50 packs/day    Types: Cigarettes  . Smokeless tobacco: Never Used  . Alcohol Use: Yes     Comment: occasional   OB History    No data available     Review  of Systems  Constitutional: Negative for fever, chills and diaphoresis.  HENT: Positive for rhinorrhea. Negative for ear discharge, ear pain, sinus pressure, sore throat and trouble swallowing.   Respiratory: Positive for cough. Negative for chest tightness, shortness of breath and wheezing.     Cardiovascular: Positive for chest pain. Negative for palpitations, orthopnea, claudication, leg swelling, syncope and PND.  Gastrointestinal: Negative for heartburn, nausea, vomiting, abdominal pain, diarrhea and constipation.  Genitourinary: Negative for dysuria and hematuria.  Musculoskeletal: Negative for myalgias, back pain and arthralgias.  Skin: Negative for color change.  Neurological: Negative for dizziness, weakness, light-headedness and numbness.  Psychiatric/Behavioral: Negative for confusion.   10 Systems reviewed and are negative for acute change except as noted in the HPI.    Allergies  Review of patient's allergies indicates no known allergies.  Home Medications   Prior to Admission medications   Medication Sig Start Date End Date Taking? Authorizing Provider  doxylamine, Sleep, (UNISOM) 25 MG tablet Take 25 mg by mouth at bedtime as needed for sleep.   Yes Historical Provider, MD  ibuprofen (ADVIL,MOTRIN) 200 MG tablet Take 600 mg by mouth every 6 (six) hours as needed for fever, headache or moderate pain.    Yes Historical Provider, MD   BP 102/63 mmHg  Pulse 78  Temp(Src) 98.3 F (36.8 C) (Oral)  Resp 18  SpO2 96%  LMP 08/27/2014 Physical Exam  Constitutional: She is oriented to person, place, and time. Vital signs are normal. She appears well-developed and well-nourished.  Non-toxic appearance. No distress.  Afebrile nontoxic NAD  HENT:  Head: Normocephalic and atraumatic.  Mouth/Throat: Oropharynx is clear and moist and mucous membranes are normal.  Eyes: Conjunctivae and EOM are normal. Right eye exhibits no discharge. Left eye exhibits no discharge.  Neck: Normal range of motion. Neck supple. No JVD present.  Cardiovascular: Normal rate, regular rhythm, normal heart sounds and intact distal pulses.  Exam reveals no gallop and no friction rub.   No murmur heard. RRR, nl s1/s2, no m/r/g, distal pulses intact, no pedal edema   Pulmonary/Chest: Effort  normal and breath sounds normal. No respiratory distress. She has no decreased breath sounds. She has no wheezes. She has no rhonchi. She has no rales. She exhibits tenderness. She exhibits no bony tenderness, no deformity and no retraction.    CTAB in all lung fields, no w/r/r, no hypoxia or increased WOB, speaking in full sentences, SpO2 96-100% on RA Mild chest wall TTP anteriorly along sternum and b/l intercostal muscles, no deformity or retractions  Abdominal: Soft. Normal appearance and bowel sounds are normal. She exhibits no distension. There is no tenderness. There is no rigidity, no rebound and no guarding.  Musculoskeletal: Normal range of motion.  Neurological: She is alert and oriented to person, place, and time. She has normal strength. No sensory deficit.  Skin: Skin is warm, dry and intact. No rash noted.  Psychiatric: She has a normal mood and affect.  Nursing note and vitals reviewed.   ED Course  Procedures (including critical care time) Labs Review Labs Reviewed  CBC WITH DIFFERENTIAL/PLATELET - Abnormal; Notable for the following:    Platelets 127 (*)    All other components within normal limits  BASIC METABOLIC PANEL  I-STAT TROPOININ, ED    Imaging Review Dg Chest 2 View  09/18/2014   CLINICAL DATA:  Chest tightness and cough for 1 day  EXAM: CHEST  2 VIEW  COMPARISON:  October 10, 2013  FINDINGS: There is no  edema or consolidation. Heart size and pulmonary vascularity are normal. No pneumothorax. No adenopathy. There is mild thoracolumbar levoscoliosis.  IMPRESSION: No edema or consolidation.   Electronically Signed   By: Bretta Bang III M.D.   On: 09/18/2014 15:08     EKG Interpretation None     Date: 09/18/2014  Rate: 64  Rhythm: normal sinus rhythm  QRS Axis: normal  Intervals: normal  ST/T Wave abnormalities: normal  Conduction Disutrbances:none  Narrative Interpretation:   Old EKG Reviewed: none available    MDM   Final diagnoses:   Costochondritis  Bronchitis  URI (upper respiratory infection)    39 y.o. female with musculoskeletal chest wall pain, tender on palpation. Ongoing x 14hrs. Trop neg, EKG WNL, CXR WNL, and labs WNL. Has had ongoing cough x1.5wks which is overall improving. No wheezing or rhonchi on exam. Likely resolving bronchitis causing some chest pain from musculoskeletal strain. Will give toradol IM now since no IV established. HEART score low, PERC neg, doubt ACS/DVT/PE/dissection. Will send home with naprosyn, discussed use of heat. Discussed use of mucinex for cough. Pt recently returned to the area, has not yet established PCP. Will give resource guide. I explained the diagnosis and have given explicit precautions to return to the ER including for any other new or worsening symptoms. The patient understands and accepts the medical plan as it's been dictated and I have answered their questions. Discharge instructions concerning home care and prescriptions have been given. The patient is STABLE and is discharged to home in good condition.  BP 102/63 mmHg  Pulse 78  Temp(Src) 98.3 F (36.8 C) (Oral)  Resp 18  SpO2 96%  LMP 08/27/2014   Meds ordered this encounter  Medications  . ketorolac (TORADOL) 30 MG/ML injection 60 mg    Sig:   . naproxen (NAPROSYN) 500 MG tablet    Sig: Take 1 tablet (500 mg total) by mouth 2 (two) times daily as needed for mild pain, moderate pain or headache (TAKE WITH MEALS.).    Dispense:  20 tablet    Refill:  0    Order Specific Question:  Supervising Provider    Answer:  Vida Roller [address] Selvey Camprubi-Soms, PA-C 09/18/14 1706  Linwood Dibbles, MD 09/18/14 [phone]

## 2014-09-18 NOTE — Discharge Instructions (Signed)
Continue to stay well-hydrated. Continue to alternate between Tylenol and naprosyn for pain or fever. Use Mucinex for cough suppression/expectoration of mucus. Use heat over the affected area to help soothe the pain, 20 minutes at a time every hour. May consider over-the-counter Benadryl or other antihistamine to decrease secretions and for watery itchy eyes. Use the list below to find a primary care doctor for follow up in 5-7 days for recheck of ongoing symptoms. Return to emergency department for emergent changing or worsening of symptoms.   Costochondritis Costochondritis is a condition in which the tissue (cartilage) that connects your ribs with your breastbone (sternum) becomes irritated. It causes pain in the chest and rib area. It usually goes away on its own over time. HOME CARE  Avoid activities that wear you out.  Do not strain your ribs. Avoid activities that use your:  Chest.  Belly.  Side muscles.  Put ice on the area for the first 2 days after the pain starts.  Put ice in a plastic bag.  Place a towel between your skin and the bag.  Leave the ice on for 20 minutes, 2-3 times a day.  Only take medicine as told by your doctor. GET HELP IF:  You have redness or puffiness (swelling) in the rib area.  Your pain does not go away with rest or medicine. GET HELP RIGHT AWAY IF:   Your pain gets worse.  You are very uncomfortable.  You have trouble breathing.  You cough up blood.  You start sweating or throwing up (vomiting).  You have a fever or lasting symptoms for more than 2-3 days.  You have a fever and your symptoms suddenly get worse. MAKE SURE YOU:   Understand these instructions.  Will watch your condition.  Will get help right away if you are not doing well or get worse. Document Released: 01/11/2008 Document Revised: 03/27/2013 Document Reviewed: 02/26/2013 [facility] Patient Information 2015 [place], [place]. This information is not intended to  replace advice given to you by your health care provider. Make sure you discuss any questions you have with your health care provider.  Cough, Adult  A cough is a reflex. It helps you clear your throat and airways. A cough can help heal your body. A cough can last 2 or 3 weeks (acute) or may last more than 8 weeks (chronic). Some common causes of a cough can include an infection, allergy, or a cold. HOME CARE  Only take medicine as told by your doctor.  If given, take your medicines (antibiotics) as told. Finish them even if you start to feel better.  Use a cold steam vaporizer or humidifier in your home. This can help loosen thick spit (secretions).  Sleep so you are almost sitting up (semi-upright). Use pillows to do this. This helps reduce coughing.  Rest as needed.  Stop smoking if you smoke. GET HELP RIGHT AWAY IF:  You have yellowish-white fluid (pus) in your thick spit.  Your cough gets worse.  Your medicine does not reduce coughing, and you are losing sleep.  You cough up blood.  You have trouble breathing.  Your pain gets worse and medicine does not help.  You have a fever. MAKE SURE YOU:   Understand these instructions.  Will watch your condition.  Will get help right away if you are not doing well or get worse. Document Released: 04/07/2011 Document Revised: 12/09/2013 Document Reviewed: 04/07/2011 [facility] Patient Information 2015 [place], [place]. This information is not intended to replace  advice given to you by your health care provider. Make sure you discuss any questions you have with your health care provider.  Upper Respiratory Infection, Adult An upper respiratory infection (URI) is also sometimes known as the common cold. The upper respiratory tract includes the nose, sinuses, throat, trachea, and bronchi. Bronchi are the airways leading to the lungs. Most people improve within 1 week, but symptoms can last up to 2 weeks. A residual cough may last even  longer.  CAUSES Many different viruses can infect the tissues lining the upper respiratory tract. The tissues become irritated and inflamed and often become very moist. Mucus production is also common. A cold is contagious. You can easily spread the virus to others by oral contact. This includes kissing, sharing a glass, coughing, or sneezing. Touching your mouth or nose and then touching a surface, which is then touched by another person, can also spread the virus. SYMPTOMS  Symptoms typically develop 1 to 3 days after you come in contact with a cold virus. Symptoms vary from person to person. They may include:  Runny nose.  Sneezing.  Nasal congestion.  Sinus irritation.  Sore throat.  Loss of voice (laryngitis).  Cough.  Fatigue.  Muscle aches.  Loss of appetite.  Headache.  Low-grade fever. DIAGNOSIS  You might diagnose your own cold based on familiar symptoms, since most people get a cold 2 to 3 times a year. Your caregiver can confirm this based on your exam. Most importantly, your caregiver can check that your symptoms are not due to another disease such as strep throat, sinusitis, pneumonia, asthma, or epiglottitis. Blood tests, throat tests, and X-rays are not necessary to diagnose a common cold, but they may sometimes be helpful in excluding other more serious diseases. Your caregiver will decide if any further tests are required. RISKS AND COMPLICATIONS  You may be at risk for a more severe case of the common cold if you smoke cigarettes, have chronic heart disease (such as heart failure) or lung disease (such as asthma), or if you have a weakened immune system. The very young and very old are also at risk for more serious infections. Bacterial sinusitis, middle ear infections, and bacterial pneumonia can complicate the common cold. The common cold can worsen asthma and chronic obstructive pulmonary disease (COPD). Sometimes, these complications can require emergency  medical care and may be life-threatening. PREVENTION  The best way to protect against getting a cold is to practice good hygiene. Avoid oral or hand contact with people with cold symptoms. Wash your hands often if contact occurs. There is no clear evidence that vitamin C, vitamin E, echinacea, or exercise reduces the chance of developing a cold. However, it is always recommended to get plenty of rest and practice good nutrition. TREATMENT  Treatment is directed at relieving symptoms. There is no cure. Antibiotics are not effective, because the infection is caused by a virus, not by bacteria. Treatment may include:  Increased fluid intake. Sports drinks offer valuable electrolytes, sugars, and fluids.  Breathing heated mist or steam (vaporizer or shower).  Eating chicken soup or other clear broths, and maintaining good nutrition.  Getting plenty of rest.  Using gargles or lozenges for comfort.  Controlling fevers with ibuprofen or acetaminophen as directed by your caregiver.  Increasing usage of your inhaler if you have asthma. Zinc gel and zinc lozenges, taken in the first 24 hours of the common cold, can shorten the duration and lessen the severity of symptoms. Pain medicines  may help with fever, muscle aches, and throat pain. A variety of non-prescription medicines are available to treat congestion and runny nose. Your caregiver can make recommendations and may suggest nasal or lung inhalers for other symptoms.  HOME CARE INSTRUCTIONS   Only take over-the-counter or prescription medicines for pain, discomfort, or fever as directed by your caregiver.  Use a warm mist humidifier or inhale steam from a shower to increase air moisture. This may keep secretions moist and make it easier to breathe.  Drink enough water and fluids to keep your urine clear or pale yellow.  Rest as needed.  Return to work when your temperature has returned to normal or as your caregiver advises. You may need to  stay home longer to avoid infecting others. You can also use a face mask and careful hand washing to prevent spread of the virus. SEEK MEDICAL CARE IF:   After the first few days, you feel you are getting worse rather than better.  You need your caregiver's advice about medicines to control symptoms.  You develop chills, worsening shortness of breath, or brown or red sputum. These may be signs of pneumonia.  You develop yellow or brown nasal discharge or pain in the face, especially when you bend forward. These may be signs of sinusitis.  You develop a fever, swollen neck glands, pain with swallowing, or white areas in the back of your throat. These may be signs of strep throat. SEEK IMMEDIATE MEDICAL CARE IF:   You have a fever.  You develop severe or persistent headache, ear pain, sinus pain, or chest pain.  You develop wheezing, a prolonged cough, cough up blood, or have a change in your usual mucus (if you have chronic lung disease).  You develop sore muscles or a stiff neck. Document Released: 01/18/2001 Document Revised: 10/17/2011 Document Reviewed: 10/30/2013 [facility] Patient Information 2015 [place], [place]. This information is not intended to replace advice given to you by your health care provider. Make sure you discuss any questions you have with your health care provider. Emergency Department Resource Guide 1) Find a Doctor and Pay Out of Pocket Although you won't have to find out who is covered by your insurance plan, it is a good idea to ask around and get recommendations. You will then need to call the office and see if the doctor you have chosen will accept you as a new patient and what types of options they offer for patients who are self-pay. Some doctors offer discounts or will set up payment plans for their patients who do not have insurance, but you will need to ask so you aren't surprised when you get to your appointment.  2) Contact Your Local Health Department Not  all health departments have doctors that can see patients for sick visits, but many do, so it is worth a call to see if yours does. If you don't know where your local health department is, you can check in your phone book. The CDC also has a tool to help you locate your state's health department, and many state websites also have listings of all of their local health departments.  3) Find a Walk-in Clinic If your illness is not likely to be very severe or complicated, you may want to try a walk in clinic. These are popping up all over the country in pharmacies, drugstores, and shopping centers. They're usually staffed by nurse practitioners or physician assistants that have been trained to treat common illnesses and complaints. They're  usually fairly quick and inexpensive. However, if you have serious medical issues or chronic medical problems, these are probably not your best option.  No Primary Care Doctor: - Call Health Connect at  [phone] - they can help you locate a primary care doctor that  accepts your insurance, provides certain services, etc. - Physician Referral Service- [phone]  Chronic Pain Problems: Organization         Address  Phone   Notes  [facility]  [phone] Patients need to be referred by their primary care doctor.   Medication Assistance: Organization         Address  Phone   Notes  [facility] [address] [place] [place], [place] [address] [phone] --Must be a resident of [facility] -- Must have NO insurance coverage whatsoever (no Medicaid/ Medicare, etc.) -- The pt. MUST have a primary care doctor that directs their care regularly and follows them in the community   [facility]  [phone]   [employer]  [phone]    Agencies that provide inexpensive medical care: Organization         Address  Phone   Notes  [facility]  [phone]   [facility]    [phone]   [facility] [address] [place], [place] [address] [phone]   [facility] [address] [place] [phone]   [facility]    [phone]   [facility]    [phone]   [facility]  [address], [place] Phone:  [phone], Fax:  [phone] Hours of Operation:  9 am - 6 pm, M-F.  Also accepts Medicaid/Medicare and self-pay.  [facility]  [address], [place] Phone: [phone], Fax: [phone]. Hours of Operation:  8:30 am - 5:30 pm, M-F.  Also accepts Medicaid and self-pay.  [facility] [place] [address] [place] Phone: [phone]   [facility] [address] Natasha Bence [place], [place] [phone], Ext. [phone] Mondays & Thursdays: 7-9 AM.  First 15 patients are seen on a first come, first serve basis.    Medicaid-accepting [facility] Providers:  Organization         Address  Phone   Notes  [facility] [address], [place] [phone] Also accepts self-pay patients.  [facility] [address] Laurell Josephs [place], [place]  [phone]   [facility] [address], [place] [phone]   [facility] [address] [place] [phone]   Renaye Rakers [address], [place]   [phone] Only accepts [place] patients after they have their name applied to their card.   Self-Pay (no insurance) in [facility]:  Organization         Address  Phone   Notes  Sickle Cell Patients, [facility] [address] [place], [place] [phone]   [facility] [address] [place], [place] [phone]   [facility] [place]  [address], [place] [phone]   [facility]/Dr. Osei-Bonsu  [address] [place] or [address], [place] [phone]  Phone number for both [place] and [place] locations is the same.  [facility] [address], [place] [phone]   [facility] [place] [address] [place] [address], [place] or [address] [phone]) [phone]   [facility] [address], [place] [phone]) [phone], phone; [phone], fax Sees patients 1st and 3rd Saturday of every month.  Must not qualify for public or private insurance (i.e. Medicaid, Medicare, [place] [facility] Choice, Veterans' Benefits)  Household income should be no more than 200% of the poverty level The clinic cannot treat you if you are pregnant or think you are pregnant  Sexually transmitted diseases are not treated at the clinic.

## 2014-09-18 NOTE — ED Notes (Signed)
Pt c/o CP with movement and cough starting last night

## 2014-11-05 ENCOUNTER — Encounter (HOSPITAL_COMMUNITY): Payer: Self-pay | Admitting: Emergency Medicine

## 2014-11-05 ENCOUNTER — Emergency Department (HOSPITAL_COMMUNITY)
Admission: EM | Admit: 2014-11-05 | Discharge: 2014-11-05 | Disposition: A | Discharge status: Home / Self Care | Payer: Self-pay | Attending: Emergency Medicine | Admitting: Emergency Medicine

## 2014-11-05 DIAGNOSIS — Z8619 Personal history of other infectious and parasitic diseases: Secondary | ICD-10-CM | POA: Insufficient documentation

## 2014-11-05 DIAGNOSIS — M799 Soft tissue disorder, unspecified: Secondary | ICD-10-CM

## 2014-11-05 DIAGNOSIS — Z720 Tobacco use: Secondary | ICD-10-CM | POA: Insufficient documentation

## 2014-11-05 DIAGNOSIS — M7989 Other specified soft tissue disorders: Secondary | ICD-10-CM | POA: Insufficient documentation

## 2014-11-05 MED ORDER — DOXYCYCLINE HYCLATE 100 MG PO CAPS: 100.0000 mg | ORAL_CAPSULE | Freq: Two times a day (BID) | ORAL | Status: DC

## 2014-11-05 NOTE — ED Notes (Signed)
Pt has been using cream for athletes foot for the past month- yesterday she noticed an open sore to the top of right foot- open wound and redness present to top of foot.  Pt denies fevers/chills.  Denies foot injury.

## 2014-11-05 NOTE — ED Provider Notes (Signed)
CSN: [id number]     Arrival date & time 11/05/14  0010 History  This chart was scribed for Purvis SheffieldForrest Jilene Spohr, MD by Bronson CurbJacqueline Melvin, ED Scribe. This patient was seen in room B16C/B16C and the patient's care was started at 2:36 AM.   Chief Complaint  Patient presents with  . Foot Pain    Patient is a 39 y.o. female presenting with lower extremity pain. The history is provided by the patient. No language interpreter was used.  Foot Pain This is a new problem. The current episode started 12 to 24 hours ago. The problem occurs rarely. The problem has not changed since onset.Pertinent negatives include no chest pain, no abdominal pain, no headaches and no shortness of breath. Nothing aggravates the symptoms. Nothing relieves the symptoms. She has tried nothing for the symptoms. The treatment provided no relief.     HPI Comments: Deforest HoylesSusan R Caffee is a 39 y.o. female, with no significant medical history, who presents to the Emergency Department complaining of constant, moderate, right foot pain for the past 24 hours. Patient states she has been using a cream for athlete's foot for the past month. She reports she noticed a painful sore to the top of the right foot yesterday with associated itching, redness, and swelling to area. Patient is unsure if symptoms are related to athlete's foot. She denies history of IV drug use. NKA to any medications.   History reviewed. No pertinent past medical history. Past Surgical History  Procedure Laterality Date  . Ankle surgery     No family history on file. History  Substance Use Topics  . Smoking status: Current Every Day Smoker -- 0.50 packs/day    Types: Cigarettes  . Smokeless tobacco: Never Used  . Alcohol Use: Yes     Comment: occasional   OB History    No data available     Review of Systems  Constitutional: Negative for fever and chills.  HENT: Negative for rhinorrhea and sore throat.   Eyes: Negative for visual disturbance.  Respiratory:  Negative for cough and shortness of breath.   Cardiovascular: Negative for chest pain and leg swelling.  Gastrointestinal: Negative for nausea, vomiting, abdominal pain and diarrhea.  Genitourinary: Negative for dysuria.  Musculoskeletal: Positive for myalgias. Negative for back pain and neck pain.  Skin: Positive for color change, rash and wound.  Neurological: Negative for dizziness, light-headedness and headaches.  Hematological: Does not bruise/bleed easily.  Psychiatric/Behavioral: Negative for confusion.      Allergies  Review of patient's allergies indicates no known allergies.  Home Medications   Prior to Admission medications   Medication Sig Start Date End Date Taking? Authorizing Provider  doxylamine, Sleep, (UNISOM) 25 MG tablet Take 25 mg by mouth at bedtime as needed for sleep.    Historical Provider, MD  ibuprofen (ADVIL,MOTRIN) 200 MG tablet Take 600 mg by mouth every 6 (six) hours as needed for fever, headache or moderate pain.     Historical Provider, MD  naproxen (NAPROSYN) 500 MG tablet Take 1 tablet (500 mg total) by mouth 2 (two) times daily as needed for mild pain, moderate pain or headache (TAKE WITH MEALS.). 09/18/14   Mercedes Camprubi-Soms, PA-C   Triage Vitals: BP 116/71 mmHg  Pulse 65  Temp(Src) 98 F (36.7 C) (Oral)  Resp 16  Ht 5\' 8"  (1.727 m)  Wt 165 lb (74.844 kg)  BMI 25.09 kg/m2  SpO2 98%  LMP 10/06/2014  Physical Exam  Constitutional: She is oriented to person, place,  and time. She appears well-developed and well-nourished. No distress.  HENT:  Head: Normocephalic and atraumatic.  Right Ear: Hearing normal.  Left Ear: Hearing normal.  Nose: Nose normal.  Mouth/Throat: Oropharynx is clear and moist and mucous membranes are normal.  Eyes: Conjunctivae and EOM are normal. Pupils are equal, round, and reactive to light.  Neck: Normal range of motion. Neck supple.  Cardiovascular: Regular rhythm, S1 normal and S2 normal.  Exam reveals no  gallop and no friction rub.   No murmur heard. Pulmonary/Chest: Effort normal and breath sounds normal. No respiratory distress. She exhibits no tenderness.  Abdominal: Soft. Normal appearance and bowel sounds are normal. There is no hepatosplenomegaly. There is no tenderness. There is no rebound, no guarding, no tenderness at McBurney's point and negative Murphy's sign. No hernia.  Musculoskeletal: Normal range of motion.  Neurological: She is alert and oriented to person, place, and time. She has normal strength. No cranial nerve deficit or sensory deficit. Coordination normal. GCS eye subscore is 4. GCS verbal subscore is 5. GCS motor subscore is 6.  Skin: Skin is warm, dry and intact. No cyanosis.  Excoriated lesion on the dorsal aspect of distal right foot with mild surround erythema but no induration noted.  Mild cracking and maceration noted between the toes of the right foot.  Psychiatric: She has a normal mood and affect. Her speech is normal and behavior is normal. Thought content normal.  Nursing note and vitals reviewed.   ED Course  Procedures (including critical care time)  DIAGNOSTIC STUDIES: Oxygen Saturation is 98% on room air, normal by my interpretation.    COORDINATION OF CARE: At 77 Discussed treatment plan with patient which includes ABX. Patient agrees.   Labs Review Labs Reviewed - No data to display  Imaging Review No results found.   EKG Interpretation None        MDM   Final diagnoses:  Soft tissue lesion of foot    6:23 AM 39 y.o. female . Complaining of athlete's foot and now a new lesion on the dorsal aspect of her foot since yesterday. She denies any IV drug use or injections. She's been using a topical athlete's foot cream. The lesion may be related to the athlete's foot or possibly cellulitis. Will cover with antibiotics and recommend patient continue use of topical antifungal. We'll recommend she return for any worsening.   I personally  performed the services described in this documentation, which was scribed in my presence. The recorded information has been reviewed and is accurate.    Purvis Sheffield, MD 11/05/14 [phone]

## 2017-01-12 ENCOUNTER — Emergency Department (HOSPITAL_COMMUNITY)
Admission: EM | Admit: 2017-01-12 | Discharge: 2017-01-12 | Disposition: A | Discharge status: Home / Self Care | Payer: Self-pay | Attending: Emergency Medicine | Admitting: Emergency Medicine

## 2017-01-12 ENCOUNTER — Emergency Department (HOSPITAL_COMMUNITY): Payer: Self-pay

## 2017-01-12 ENCOUNTER — Encounter (HOSPITAL_COMMUNITY): Payer: Self-pay

## 2017-01-12 DIAGNOSIS — F17210 Nicotine dependence, cigarettes, uncomplicated: Secondary | ICD-10-CM | POA: Insufficient documentation

## 2017-01-12 DIAGNOSIS — M25571 Pain in right ankle and joints of right foot: Secondary | ICD-10-CM | POA: Insufficient documentation

## 2017-01-12 NOTE — ED Triage Notes (Signed)
Per Pt, Pt is coming from home with complaints of right ankle discomfort secondary to an old surgery. Reports discomfort mainly with long periods of exertion.

## 2017-01-12 NOTE — ED Provider Notes (Signed)
[facility]-EMERGENCY DEPT Provider Note   CSN: [id number] Arrival date & time: 01/12/17  1557  By signing my name below, I, Linna Darner, attest that this documentation has been prepared under the direction and in the presence of [facility] M. Damian Leavell, NP. Electronically Signed: Linna Darner, Scribe. 01/12/2017. 5:39 PM.  History   Chief Complaint Chief Complaint  Patient presents with  . Ankle Pain   The history is provided by the patient. No language interpreter was used.  Ankle Pain   The incident occurred more than 2 days ago. The incident occurred at home. There was no injury mechanism. The pain is present in the right ankle. The pain is at a severity of 5/10. The pain is moderate. The pain has been constant since onset. Pertinent negatives include no numbness, no inability to bear weight, no muscle weakness, no loss of sensation and no tingling. The symptoms are aggravated by bearing weight. She has tried nothing for the symptoms.    HPI Comments: Brittney Price is a 41 y.o. female with a history of right ankle surgery who presents to the Emergency Department complaining of constant right ankle pain without known cause for 4 days. She currently rates her pain at 5/10 in severity at rest but notes it is worse with weight bearing. Patient reports some associated swelling which has gradually improved over the last couple of days. No medications or treatments tried. She had ankle surgery with hardware placement performed by Dr. Magnus Ivan 10 years ago. She denies numbness/tingling, focal weakness, or any other associated symptoms.  History reviewed. No pertinent past medical history.  There are no active problems to display for this patient.   Past Surgical History:  Procedure Laterality Date  . ANKLE SURGERY      OB History    No data available       Home Medications    Prior to Admission medications   Medication Sig Start Date End Date Taking? Authorizing Provider  doxycycline  (VIBRAMYCIN) 100 MG capsule Take 1 capsule (100 mg total) by mouth 2 (two) times daily. One po bid x 7 days 11/05/14   Purvis Sheffield, MD  naproxen (NAPROSYN) 500 MG tablet Take 1 tablet (500 mg total) by mouth 2 (two) times daily as needed for mild pain, moderate pain or headache (TAKE WITH MEALS.). Patient not taking: Reported on 11/05/2014 09/18/14   Street, [place], PA-C    Family History No family history on file.  Social History Social History  Substance Use Topics  . Smoking status: Current Every Day Smoker    Packs/day: 0.50    Types: Cigarettes  . Smokeless tobacco: Never Used  . Alcohol use Yes     Comment: occasional     Allergies   Patient has no known allergies.   Review of Systems Review of Systems  Musculoskeletal: Positive for arthralgias and joint swelling.  Neurological: Negative for tingling, weakness and numbness.  All other systems reviewed and are negative.  Physical Exam Updated Vital Signs BP (!) 100/53 (BP Location: Left Arm)   Pulse 70   Temp 98.4 F (36.9 C) (Oral)   Resp 18   Ht 5' 8.5" (1.74 m)   Wt 70.3 kg (155 lb)   SpO2 97%   BMI 23.22 kg/m   Physical Exam  Constitutional: She is oriented to person, place, and time. She appears well-developed and well-nourished. No distress.  HENT:  Head: Normocephalic and atraumatic.  Eyes: Conjunctivae and EOM are normal.  Neck: Neck supple. No  tracheal deviation present.  Cardiovascular: Normal rate.   Pulses:      Dorsalis pedis pulses are 2+ on the right side, and 2+ on the left side.  Adequate circulation.  Pulmonary/Chest: Effort normal. No respiratory distress.  Musculoskeletal: Normal range of motion.  Swelling to medial and lateral aspects of right ankle. Scars noted from previous surgery. Strength is equal in lower extremities. Plantar and dorsiflexion without difficulty.  Neurological: She is alert and oriented to person, place, and time.  Skin: Skin is warm and dry.  Psychiatric:  She has a normal mood and affect. Her behavior is normal.  Nursing note and vitals reviewed.  ED Treatments / Results  Labs (all labs ordered are listed, but only abnormal results are displayed) Labs Reviewed - No data to display  Radiology Dg Ankle Complete Right  Result Date: 01/12/2017 CLINICAL DATA:  41 year old female with right ankle pain and swelling and history of prior injury requiring surgical repair EXAM: RIGHT ANKLE - COMPLETE 3+ VIEW COMPARISON:  Prior radiographs of the right ankle 07/27/2007 FINDINGS: Surgical changes of prior ORIF of a bimalleolar fracture with a lateral buttress plate and screw construct transfixing a now healed fibula with an additional single inter fragmentary screw. Two cannulated lag screws transfix a now healed medial malleolar fracture. 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 Mild soft tissue swelling about the ankle. No evidence of hardware complication or periprosthetic fracture. IMPRESSION: 1. Surgical changes of prior bimalleolar ORIF without evidence of hardware complication. 2. 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 Electronically Signed   By: Malachy MoanHeath  McCullough M.D.   On: 01/12/2017 16:59    Procedures Procedures (including critical care time)  DIAGNOSTIC STUDIES: Oxygen Saturation is 97% on RA, normal by my interpretation.    COORDINATION OF CARE: 5:37 PM Discussed treatment plan with pt at bedside and pt agreed to plan.  Medications Ordered in ED Medications - No data to display   Initial Impression / Assessment and Plan / ED Course  I have reviewed the triage vital signs and the nursing notes. Patient X-Ray negative for obvious fracture or dislocation.  Pt advised to follow up with  the orthopedic doctor who did her surgery. Patient given ace wrap while in ED, conservative therapy recommended and discussed. Patient will be discharged home & is agreeable with above plan. Returns precautions discussed. Pt appears safe for discharge. No focal neuro deficits.  Final Clinical Impressions(s) / ED Diagnoses   Final diagnoses:  Acute right ankle pain    New Prescriptions New Prescriptions   No medications on file   I personally performed the services described in this documentation, which was scribed in my presence. The recorded information has been reviewed and is accurate.    Kerrie Buffaloeese, [place] High[place] Lakes[place], [place]NP 01/12/17 1746    Shaune PollackIsaacs, [place], MD 01/13/17 [phone]

## 2017-01-12 NOTE — ED Notes (Signed)
Patient transported to X-ray 

## 2017-01-12 NOTE — Discharge Instructions (Signed)
Follow up with your orthopedic doctor. Take ibuprofen as needed for pain.

## 2017-01-12 NOTE — ED Notes (Signed)
See EDP secondary assessment.  

## 2017-02-16 ENCOUNTER — Emergency Department (HOSPITAL_COMMUNITY): Payer: Medicaid Other

## 2017-02-16 ENCOUNTER — Encounter (HOSPITAL_COMMUNITY): Payer: Self-pay

## 2017-02-16 ENCOUNTER — Emergency Department (HOSPITAL_COMMUNITY)
Admission: EM | Admit: 2017-02-16 | Discharge: 2017-02-16 | Disposition: A | Discharge status: Home / Self Care | Payer: Medicaid Other | Attending: Emergency Medicine | Admitting: Emergency Medicine

## 2017-02-16 DIAGNOSIS — M7989 Other specified soft tissue disorders: Secondary | ICD-10-CM

## 2017-02-16 DIAGNOSIS — R2241 Localized swelling, mass and lump, right lower limb: Secondary | ICD-10-CM | POA: Insufficient documentation

## 2017-02-16 DIAGNOSIS — F17210 Nicotine dependence, cigarettes, uncomplicated: Secondary | ICD-10-CM | POA: Insufficient documentation

## 2017-02-16 LAB — I-STAT CHEM 8, ED
BUN: 15 mg/dL (ref 6–20)
CHLORIDE: 103 mmol/L (ref 101–111)
CREATININE: 0.7 mg/dL (ref 0.44–1.00)
Calcium, Ion: 1.2 mmol/L (ref 1.15–1.40)
GLUCOSE: 97 mg/dL (ref 65–99)
HCT: 35 % — ABNORMAL LOW (ref 36.0–46.0)
Hemoglobin: 11.9 g/dL — ABNORMAL LOW (ref 12.0–15.0)
POTASSIUM: 3.9 mmol/L (ref 3.5–5.1)
Sodium: 141 mmol/L (ref 135–145)
TCO2: 26 mmol/L (ref 0–100)

## 2017-02-16 MED ORDER — ENOXAPARIN SODIUM 80 MG/0.8ML ~~LOC~~ SOLN
1.0000 mg/kg | Freq: Once | SUBCUTANEOUS | Status: AC
  Administered 2017-02-16: 22:00:00 70 mg via SUBCUTANEOUS
  Filled 2017-02-16: qty 0.8

## 2017-02-16 NOTE — ED Triage Notes (Signed)
PT states she has increases swelling to right ankle since she was here a month ago to get right ankle checked out. PT has hx of fx to foot with plates and screws. PT ambulatory to triage.

## 2017-02-16 NOTE — Discharge Instructions (Signed)
Please return in morning for ultrasound of right leg as scheduled.

## 2017-02-17 ENCOUNTER — Ambulatory Visit (HOSPITAL_COMMUNITY)
Admission: RE | Admit: 2017-02-17 | Discharge: 2017-02-17 | Disposition: A | Discharge status: Home / Self Care | Payer: Self-pay | Source: Ambulatory Visit | Attending: Emergency Medicine | Admitting: Emergency Medicine

## 2017-02-17 DIAGNOSIS — M7989 Other specified soft tissue disorders: Secondary | ICD-10-CM | POA: Insufficient documentation

## 2017-02-17 DIAGNOSIS — M79609 Pain in unspecified limb: Secondary | ICD-10-CM

## 2017-02-17 NOTE — Progress Notes (Signed)
**  Preliminary report by tech**  Right lower extremity venous duplex complete. There is no evidence of deep or superficial vein thrombosis involving the right lower extremity. All visualized vessels appear patent and compressible. There is no evidence of a Baker's cyst on the right.  02/17/17 8:32 AM Olen CordialGreg Kennady Zimmerle RVT

## 2017-02-17 NOTE — ED Provider Notes (Signed)
[facility] DEPT Provider Note   CSN: [id number] Arrival date & time: 02/16/17  1749     History   Chief Complaint Chief Complaint  Patient presents with  . Ankle Pain    HPI LUNDYN COSTE is a 41 y.o. female.  HPI 41 y.o. Female with right ankle swelling over past several weeks.  She has h.o. orif this ankle 2008 and has experienced intermittent swelling.  This is bilateral and onto foot.  It is worse than previous swelling.  She has some mild erythema, but no distinct areas, fluctuance, or warmth.  She denies h.o. Of dvt, pe, chest pain, or dyspnea.  She is not diabetic.  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 No trauma.  History reviewed. No pertinent past medical history.  There are no active problems to display for this patient.   Past Surgical History:  Procedure Laterality Date  . ANKLE SURGERY      OB History    No data available       Home Medications    Prior to Admission medications   Medication Sig Start Date End Date Taking? Authorizing Provider  doxycycline (VIBRAMYCIN) 100 MG capsule Take 1 capsule (100 mg total) by mouth 2 (two) times daily. One po bid x 7 days 11/05/14   Purvis Sheffield, MD  naproxen (NAPROSYN) 500 MG tablet Take 1 tablet (500 mg total) by mouth 2 (two) times daily as needed for mild pain, moderate pain or headache (TAKE WITH MEALS.). Patient not taking: Reported on 11/05/2014 09/18/14   Street, [place], PA-C    Family History No family history on file.  Social History Social History  Substance Use Topics  . Smoking status: Current Every Day Smoker    Packs/day: 0.50    Types: Cigarettes  . Smokeless tobacco: Never Used  . Alcohol use Yes     Comment: occasional     Allergies   Patient has no known allergies.   Review of Systems Review of Systems  All other systems reviewed and are negative.    Physical Exam Updated Vital Signs BP 109/68   Pulse (!) 52   Temp 98.2  F (36.8 C) (Oral)   Resp 16   Ht 1.727 m (5\' 8" )   Wt 70.3 kg (155 lb)   LMP 01/25/2017   SpO2 100%   BMI 23.57 kg/m   Physical Exam  Constitutional: She is oriented to person, place, and time. She appears well-developed and well-nourished. No distress.  HENT:  Head: Normocephalic and atraumatic.  Right Ear: External ear normal.  Left Ear: External ear normal.  Nose: Nose normal.  Eyes: Pupils are equal, round, and reactive to light. Conjunctivae and EOM are normal.  Neck: Normal range of motion. Neck supple.  Pulmonary/Chest: Effort normal.  Musculoskeletal: Normal range of motion. She exhibits edema.       Feet:  Swelling edema right foot and calf larger on right than left.   Neurological: She is alert and oriented to person, place, and time. She exhibits normal muscle tone. Coordination normal.  Skin: Skin is warm and dry.  Psychiatric: She has a normal mood and affect. Her behavior is normal. Thought content normal.  Nursing note and vitals reviewed.    ED Treatments / Results  Labs (all labs ordered are listed, but only abnormal results are displayed) Labs Reviewed  I-STAT CHEM 8, ED - Abnormal; Notable for the following:  Result Value   Hemoglobin 11.9 (*)    HCT 35.0 (*)    All other components within normal limits    EKG  EKG Interpretation None       Radiology Dg Ankle Complete Right  Result Date: 02/16/2017 CLINICAL DATA:  Ankle pain and swelling for 1 month. EXAM: RIGHT ANKLE - COMPLETE 3+ VIEW COMPARISON:  01/12/2017 FINDINGS: Surgical hardware again seen in the distal fibula and medial malleolus. No evidence of hardware failure or loosening. Old fracture deformity of the ankle is noted with severe tibiotalar osteoarthritis, without significant change since prior study. No other bone lesions identified. Bilateral soft tissue swelling, without evidence of radiopaque foreign body or soft tissue gas. IMPRESSION: Soft tissue swelling.  No acute  osseous abnormality. 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 Electronically Signed   By: Myles RosenthalJohn  Stahl M.D.   On: 02/16/2017 20:16    Procedures Procedures (including critical care time)  Medications Ordered in ED Medications  enoxaparin (LOVENOX) injection 70 mg (70 mg Subcutaneous Given 02/16/17 2214)     Initial Impression / Assessment and Plan / ED Course  I have reviewed the triage vital signs and the nursing notes.  Pertinent labs & imaging results that were available during my care of the patient were reviewed by me and considered in my medical decision making (see chart for details).      Patient treated here with lovenox and scheduled for op doppler in am.  Low suspicion for infection- cellulitis, ankle joint, or hardware.  Discussed with patient and in agreement with plan.   Final Clinical Impressions(s) / ED Diagnoses   Final diagnoses:  Swelling of right lower extremity    New Prescriptions Discharge Medication List as of 02/16/2017  9:54 PM       Margarita Grizzleay, Cullin Dishman, MD 02/17/17 1446

## 2017-06-07 ENCOUNTER — Emergency Department (HOSPITAL_COMMUNITY)
Admission: EM | Admit: 2017-06-07 | Discharge: 2017-06-07 | Disposition: A | Discharge status: Home / Self Care | Payer: Self-pay | Attending: Emergency Medicine | Admitting: Emergency Medicine

## 2017-06-07 ENCOUNTER — Encounter (HOSPITAL_COMMUNITY): Payer: Self-pay | Admitting: *Deleted

## 2017-06-07 DIAGNOSIS — K029 Dental caries, unspecified: Secondary | ICD-10-CM | POA: Insufficient documentation

## 2017-06-07 DIAGNOSIS — F17210 Nicotine dependence, cigarettes, uncomplicated: Secondary | ICD-10-CM | POA: Insufficient documentation

## 2017-06-07 DIAGNOSIS — K0889 Other specified disorders of teeth and supporting structures: Secondary | ICD-10-CM

## 2017-06-07 DIAGNOSIS — K047 Periapical abscess without sinus: Secondary | ICD-10-CM | POA: Insufficient documentation

## 2017-06-07 MED ORDER — IBUPROFEN 800 MG PO TABS: 800.0000 mg | ORAL_TABLET | Freq: Three times a day (TID) | ORAL | 0 refills | Status: DC | PRN

## 2017-06-07 MED ORDER — PENICILLIN V POTASSIUM 500 MG PO TABS: 500.0000 mg | ORAL_TABLET | Freq: Four times a day (QID) | ORAL | 0 refills | Status: DC

## 2017-06-07 MED ORDER — TRAMADOL HCL 50 MG PO TABS: 50.0000 mg | ORAL_TABLET | Freq: Four times a day (QID) | ORAL | 0 refills | Status: DC | PRN

## 2017-06-07 NOTE — ED Provider Notes (Signed)
[facility]-EMERGENCY DEPT Provider Note   CSN: [id number] Arrival date & time: 06/07/17  [id number]     History   Chief Complaint Chief Complaint  Patient presents with  . Dental Pain    HPI Brittney Price is a 41 y.o. female.  HPI Patient presents to the emergency department with dental pain that started several days ago the patient states she has had problems with this tooth in the past.  She states that she took ibuprofen and Tylenol without relief of her symptoms.  She states that nothing seems to make the condition better palpation of the area makes the pain worse.  Patient states she does not have a dentist at this time.  Patient denies throat swelling, difficulty swallowing, difficulty breathing, tongue swelling, shortness of breath, nausea, vomiting, fever, or syncope History reviewed. No pertinent past medical history.  There are no active problems to display for this patient.   Past Surgical History:  Procedure Laterality Date  . ANKLE SURGERY      OB History    No data available       Home Medications    Prior to Admission medications   Medication Sig Start Date End Date Taking? Authorizing Provider  doxycycline (VIBRAMYCIN) 100 MG capsule Take 1 capsule (100 mg total) by mouth 2 (two) times daily. One po bid x 7 days 11/05/14   Purvis SheffieldHarrison, [place], MD  naproxen (NAPROSYN) 500 MG tablet Take 1 tablet (500 mg total) by mouth 2 (two) times daily as needed for mild pain, moderate pain or headache (TAKE WITH MEALS.). Patient not taking: Reported on 11/05/2014 09/18/14   Street, [place], PA-C    Family History No family history on file.  Social History Social History  Substance Use Topics  . Smoking status: Current Every Day Smoker    Packs/day: 0.50    Types: Cigarettes  . Smokeless tobacco: Never Used  . Alcohol use Yes     Comment: occasional     Allergies   Patient has no known allergies.   Review of Systems Review of  Systems  All other systems negative except as documented in the HPI. All pertinent positives and negatives as reviewed in the HPI. Physical Exam Updated Vital Signs BP 133/88 (BP Location: Left Arm)   Pulse 70   Temp 98.6 F (37 C) (Oral)   Resp 16   LMP 06/04/2017   SpO2 98%   Physical Exam  Constitutional: She is oriented to person, place, and time. She appears well-developed and well-nourished. No distress.  HENT:  Head: Normocephalic and atraumatic.  Mouth/Throat:    Eyes: Pupils are equal, round, and reactive to light.  Pulmonary/Chest: Effort normal.  Neurological: She is alert and oriented to person, place, and time.  Skin: Skin is warm and dry.  Psychiatric: She has a normal mood and affect.  Nursing note and vitals reviewed.    ED Treatments / Results  Labs (all labs ordered are listed, but only abnormal results are displayed) Labs Reviewed - No data to display  EKG  EKG Interpretation None       Radiology No results found.  Procedures Procedures (including critical care time)  Medications Ordered in ED Medications - No data to display   Initial Impression / Assessment and Plan / ED Course  I have reviewed the triage vital signs and the nursing notes.  Pertinent labs & imaging results that were available during my care of the patient were reviewed by me and considered  in my medical decision making (see chart for details).     Patient will be sent to dentistry for follow-up. told to return here as needed.  Patient is advised to plan and all questions were answered.  Patient has no concerning features to problem.  Final Clinical Impressions(s) / ED Diagnoses   Final diagnoses:  None    New Prescriptions New Prescriptions   No medications on file     Charlestine Night, Cordelia Poche 06/07/17 1048    Benjiman Core, MD 06/07/17 1535

## 2017-06-07 NOTE — Discharge Instructions (Signed)
,   [facility] will cover your first visit to the dentist call them today for an appointment. return here as needed

## 2017-06-07 NOTE — ED Triage Notes (Signed)
Pt complains of right upper dental pain since yesterday. Pt states she cannot afford to go to dentist. Pt has tylenol and ibuprofen w/o relief.

## 2017-06-09 ENCOUNTER — Encounter (HOSPITAL_COMMUNITY): Payer: Self-pay

## 2017-06-09 ENCOUNTER — Emergency Department (HOSPITAL_COMMUNITY)
Admission: EM | Admit: 2017-06-09 | Discharge: 2017-06-09 | Disposition: A | Discharge status: Home / Self Care | Payer: Medicaid Other | Attending: Emergency Medicine | Admitting: Emergency Medicine

## 2017-06-09 DIAGNOSIS — Z5321 Procedure and treatment not carried out due to patient leaving prior to being seen by health care provider: Secondary | ICD-10-CM | POA: Insufficient documentation

## 2017-06-09 NOTE — ED Triage Notes (Signed)
Pt endorses dental pain/abscess x 4 days, [facility]. Pt was working this morning and abscess began draining and the pt's work told her she had to go to the dr. VSS.

## 2017-06-09 NOTE — ED Notes (Signed)
No answer from lobby x1. 

## 2017-06-09 NOTE — ED Notes (Signed)
Pt called for treatment room multiple times no answer nurse first and primary aware

## 2017-07-10 ENCOUNTER — Emergency Department (HOSPITAL_COMMUNITY)
Admission: EM | Admit: 2017-07-10 | Discharge: 2017-07-10 | Disposition: A | Discharge status: Home / Self Care | Payer: Medicaid Other | Attending: Emergency Medicine | Admitting: Emergency Medicine

## 2017-07-10 ENCOUNTER — Other Ambulatory Visit: Payer: Self-pay

## 2017-07-10 ENCOUNTER — Emergency Department (HOSPITAL_COMMUNITY): Payer: Medicaid Other

## 2017-07-10 ENCOUNTER — Encounter (HOSPITAL_COMMUNITY): Payer: Self-pay

## 2017-07-10 DIAGNOSIS — Z79899 Other long term (current) drug therapy: Secondary | ICD-10-CM | POA: Insufficient documentation

## 2017-07-10 DIAGNOSIS — J3489 Other specified disorders of nose and nasal sinuses: Secondary | ICD-10-CM | POA: Insufficient documentation

## 2017-07-10 DIAGNOSIS — R69 Illness, unspecified: Secondary | ICD-10-CM | POA: Insufficient documentation

## 2017-07-10 DIAGNOSIS — F17210 Nicotine dependence, cigarettes, uncomplicated: Secondary | ICD-10-CM | POA: Insufficient documentation

## 2017-07-10 DIAGNOSIS — J111 Influenza due to unidentified influenza virus with other respiratory manifestations: Secondary | ICD-10-CM

## 2017-07-10 DIAGNOSIS — R0981 Nasal congestion: Secondary | ICD-10-CM | POA: Insufficient documentation

## 2017-07-10 MED ORDER — FLUTICASONE PROPIONATE 50 MCG/ACT NA SUSP: 1.0000 | Freq: Every day | NASAL | 2 refills | Status: DC

## 2017-07-10 MED ORDER — BENZONATATE 100 MG PO CAPS: 100.0000 mg | ORAL_CAPSULE | Freq: Three times a day (TID) | ORAL | 0 refills | Status: DC | PRN

## 2017-07-10 MED ORDER — IBUPROFEN 800 MG PO TABS: 800.0000 mg | ORAL_TABLET | Freq: Three times a day (TID) | ORAL | 0 refills | Status: DC | PRN

## 2017-07-10 NOTE — ED Notes (Signed)
Patient transported to x-ray. ?

## 2017-07-10 NOTE — ED Triage Notes (Signed)
Pt reports cough and fatigue, generalized body aches X1 week. Pt states she has discomfort in her chest when coughing. Afebrile in triage. No distress noted.

## 2017-07-10 NOTE — ED Provider Notes (Signed)
[facility] Provider Note   CSN: [id number] Arrival date & time: 07/10/17  1548     History   Chief Complaint Chief Complaint  Patient presents with  . URI    HPI Brittney Price is a 41 y.o. female with a hx of tobacco abuse presents to the ED with complaints of URI symptoms x 1 week. Patient reports symptoms started with congestion and rhinorrhea then progressed to cough productive of green mucous sputum and myalgias. States she is having mild discomfort in her chest specific to coughing, no other chest pain. Has tried Nyquil with some relief, no other alleviating/aggravating factors. Patient denies fever, chills, dyspnea, hemoptysis, ear pain, or sore throat. Did not receive flu shot this year.   HPI  History reviewed. No pertinent past medical history.  There are no active problems to display for this patient.   Past Surgical History:  Procedure Laterality Date  . ANKLE SURGERY      OB History    No data available       Home Medications    Prior to Admission medications   Medication Sig Start Date End Date Taking? Authorizing Provider  doxycycline (VIBRAMYCIN) 100 MG capsule Take 1 capsule (100 mg total) by mouth 2 (two) times daily. One po bid x 7 days 11/05/14   Purvis SheffieldHarrison, Forrest, MD  ibuprofen (ADVIL,MOTRIN) 800 MG tablet Take 1 tablet (800 mg total) by mouth every 8 (eight) hours as needed. 06/07/17   Lawyer, Cristal Deerhristopher, PA-C  naproxen (NAPROSYN) 500 MG tablet Take 1 tablet (500 mg total) by mouth 2 (two) times daily as needed for mild pain, moderate pain or headache (TAKE WITH MEALS.). Patient not taking: Reported on 11/05/2014 09/18/14   Street, MoenkopiMercedes, PA-C  penicillin v potassium (VEETID) 500 MG tablet Take 1 tablet (500 mg total) by mouth 4 (four) times daily. 06/07/17   Lawyer, Cristal Deerhristopher, PA-C  traMADol (ULTRAM) 50 MG tablet Take 1 tablet (50 mg total) by mouth every 6 (six) hours as needed for severe pain. 06/07/17    Charlestine NightLawyer, Christopher, PA-C    Family History History reviewed. No pertinent family history.  Social History Social History   Tobacco Use  . Smoking status: Current Every Day Smoker    Packs/day: 0.50    Types: Cigarettes  . Smokeless tobacco: Never Used  Substance Use Topics  . Alcohol use: Yes    Comment: occasional  . Drug use: No     Allergies   Patient has no known allergies.   Review of Systems Review of Systems  Constitutional: Negative for chills and fever.  HENT: Positive for congestion and rhinorrhea. Negative for ear pain, sinus pain and sore throat.   Eyes: Negative for pain, discharge, itching and visual disturbance.  Respiratory: Positive for cough. Negative for shortness of breath.   Cardiovascular: Positive for chest pain (specific to coughing, no other chest pain). Negative for palpitations and leg swelling.  Gastrointestinal: Negative for abdominal pain, blood in stool, constipation, diarrhea, nausea and vomiting.  Genitourinary: Negative for dysuria.  Musculoskeletal: Positive for myalgias.  Neurological: Negative for dizziness and light-headedness.  All other systems reviewed and are negative.    Physical Exam Updated Vital Signs BP (!) 105/54 (BP Location: Right Arm)   Pulse 72   Temp 98.4 F (36.9 C) (Oral)   Resp 16   LMP 07/03/2017 (Within Days)   SpO2 98%   Physical Exam  Constitutional: She appears well-developed and well-nourished. No distress.  HENT:  Head: Normocephalic and atraumatic.  Right Ear: Tympanic membrane is not perforated, not erythematous, not retracted and not bulging.  Left Ear: Tympanic membrane is not perforated, not erythematous, not retracted and not bulging.  Nose: Right sinus exhibits no maxillary sinus tenderness and no frontal sinus tenderness. Left sinus exhibits no maxillary sinus tenderness and no frontal sinus tenderness.  Mouth/Throat: Uvula is midline and oropharynx is clear and moist. No oropharyngeal  exudate or posterior oropharyngeal erythema.  Nasal congestion present.   Eyes: Conjunctivae are normal. Pupils are equal, round, and reactive to light. Right eye exhibits no discharge. Left eye exhibits no discharge.  Neck: Normal range of motion. Neck supple.  Cardiovascular: Normal rate and regular rhythm.  No murmur heard. Pulmonary/Chest: Breath sounds normal. No respiratory distress. She has no wheezes. She has no rales.  Abdominal: Soft. She exhibits no distension. There is no tenderness.  Musculoskeletal: She exhibits no edema.  Lymphadenopathy:    She has no cervical adenopathy.  Neurological: She is alert.  Skin: Skin is warm and dry. No rash noted.  Psychiatric: She has a normal mood and affect. Her behavior is normal.  Nursing note and vitals reviewed.    ED Treatments / Results  Labs (all labs ordered are listed, but only abnormal results are displayed) Labs Reviewed - No data to display  EKG  EKG Interpretation None      Radiology Dg Chest 2 View  Result Date: 07/10/2017 CLINICAL DATA:  Cough and fatigue. Generalized body aches for 1 week. Chest discomfort. EXAM: CHEST  2 VIEW COMPARISON:  Chest radiograph September 18, 2014 FINDINGS: Cardiomediastinal silhouette is normal. No pleural effusions or focal consolidations. Trachea projects midline and there is no pneumothorax. Soft tissue planes and included osseous structures are non-suspicious. IMPRESSION: Stable normal chest. Electronically Signed   By: Awilda Metro M.D.   On: 07/10/2017 17:02    Procedures Procedures (including critical care time)  Medications Ordered in ED Medications - No data to display   Initial Impression / Assessment and Plan / ED Course  I have reviewed the triage vital signs and the nursing notes.  Pertinent labs & imaging results that were available during my care of the patient were reviewed by me and considered in my medical decision making (see chart for details).   Patient  presents with symptoms consistent with viral illness. She is nontoxic appearing with stable vital signs. Patient is afebrile and without adventitious sounds on lung exam, CXR negative for infiltrate, doubt PNA. No wheezing on exam. Afebrile, no sinus tenderness, doubt sinusitis. Centor score 0, doubt strep pharyngitis. Suspect viral etiology, will treat supportively with Ibuprofen, Flonase, and Tessalon. I discussed results, treatment plan, need for PCP follow-up, and return precautions with the patient. Provided opportunity for questions, patient confirmed understanding and is in agreement with plan.   Vitals:   07/10/17 1637 07/10/17 1724  BP: (!) 105/54 97/61  Pulse: 72 71  Resp: 16 16  Temp: 98.4 F (36.9 C) 98.1 F (36.7 C)  SpO2: 98% 96%    Final Clinical Impressions(s) / ED Diagnoses   Final diagnoses:  Influenza-like illness    ED Discharge Orders        Ordered    benzonatate (TESSALON) 100 MG capsule  3 times daily PRN     07/10/17 1714    fluticasone (FLONASE) 50 MCG/ACT nasal spray  Daily     07/10/17 1714    ibuprofen (ADVIL,MOTRIN) 800 MG tablet  Every 8 hours PRN  07/10/17 1714       Cherly Andersonetrucelli, Samantha R, PA-C 07/10/17 1731    Linwood DibblesKnapp, Jon, MD 07/11/17 [id number]

## 2017-07-10 NOTE — Discharge Instructions (Signed)
You were seen in the emergency today and diagnosed with a viral illness.  I have prescribed you multiple medications to treat your symptoms.   -Flonase to be used 1 spray in each nostril daily.  This medication is used to treat your congestion.  -Tessalon can be taken once every 8 hours as needed.  This medication is used to treat your cough.  -Ibuprofen to be taken once every 8 hours as needed for pain.  You will need to follow-up with your primary care provider in 1 week if your symptoms have not improved.  If you do not have a primary care provider one is provided in your discharge instructions.  Return to the emergency department for any new or worsening symptoms including but not limited to persistent fever for 5 days, difficulty breathing, chest pain, or passing out.

## 2018-10-16 ENCOUNTER — Encounter (HOSPITAL_COMMUNITY): Payer: Self-pay | Admitting: *Deleted

## 2018-10-16 ENCOUNTER — Emergency Department (HOSPITAL_COMMUNITY)
Admission: EM | Admit: 2018-10-16 | Discharge: 2018-10-16 | Disposition: A | Discharge status: Home / Self Care | Payer: Medicaid Other | Attending: Emergency Medicine | Admitting: Emergency Medicine

## 2018-10-16 ENCOUNTER — Emergency Department (HOSPITAL_COMMUNITY): Payer: Medicaid Other

## 2018-10-16 DIAGNOSIS — F17210 Nicotine dependence, cigarettes, uncomplicated: Secondary | ICD-10-CM | POA: Insufficient documentation

## 2018-10-16 DIAGNOSIS — R05 Cough: Secondary | ICD-10-CM | POA: Insufficient documentation

## 2018-10-16 DIAGNOSIS — R0602 Shortness of breath: Secondary | ICD-10-CM | POA: Insufficient documentation

## 2018-10-16 DIAGNOSIS — R531 Weakness: Secondary | ICD-10-CM

## 2018-10-16 DIAGNOSIS — Z79899 Other long term (current) drug therapy: Secondary | ICD-10-CM | POA: Insufficient documentation

## 2018-10-16 LAB — CBC WITH DIFFERENTIAL/PLATELET
Abs Immature Granulocytes: 0.01 10*3/uL (ref 0.00–0.07)
Basophils Absolute: 0 10*3/uL (ref 0.0–0.1)
Basophils Relative: 0 %
EOS ABS: 0 10*3/uL (ref 0.0–0.5)
Eosinophils Relative: 1 %
HEMATOCRIT: 40.1 % (ref 36.0–46.0)
Hemoglobin: 13.4 g/dL (ref 12.0–15.0)
IMMATURE GRANULOCYTES: 0 %
LYMPHS ABS: 2.1 10*3/uL (ref 0.7–4.0)
Lymphocytes Relative: 32 %
MCH: 31.8 pg (ref 26.0–34.0)
MCHC: 33.4 g/dL (ref 30.0–36.0)
MCV: 95.2 fL (ref 80.0–100.0)
MONOS PCT: 7 %
Monocytes Absolute: 0.4 10*3/uL (ref 0.1–1.0)
NEUTROS PCT: 60 %
Neutro Abs: 3.9 10*3/uL (ref 1.7–7.7)
Platelets: 153 10*3/uL (ref 150–400)
RBC: 4.21 MIL/uL (ref 3.87–5.11)
RDW: 11.9 % (ref 11.5–15.5)
WBC: 6.5 10*3/uL (ref 4.0–10.5)
nRBC: 0 % (ref 0.0–0.2)

## 2018-10-16 LAB — BASIC METABOLIC PANEL
ANION GAP: 6 (ref 5–15)
BUN: 7 mg/dL (ref 6–20)
CALCIUM: 8.8 mg/dL — AB (ref 8.9–10.3)
CO2: 25 mmol/L (ref 22–32)
Chloride: 112 mmol/L — ABNORMAL HIGH (ref 98–111)
Creatinine, Ser: 0.6 mg/dL (ref 0.44–1.00)
GFR calc Af Amer: >60 mL/min (ref >60)
GFR calc non Af Amer: >60 mL/min (ref >60)
GLUCOSE: 104 mg/dL — AB (ref 70–99)
POTASSIUM: 3.6 mmol/L (ref 3.5–5.1)
Sodium: 143 mmol/L (ref 135–145)

## 2018-10-16 NOTE — ED Provider Notes (Signed)
[facility]-EMERGENCY DEPT Provider Note   CSN: [id number] Arrival date & time: 10/16/18  0804    History   Chief Complaint Chief Complaint  Patient presents with  . Diarrhea    HPI Brittney Price is a 43 y.o. female.     43 year old female presents with 3 weeks of generalized weakness.  Has had a productive cough of green sputum.  Is a smoker.  No nausea or vomiting.  Slight watery diarrhea.  No chest or abdominal discomfort.  No weight loss.  Has had positive flu exposures.  Denies any sore throat or ear pain.  No headache or neck discomfort.  No treatment use prior to arrival nothing makes her symptoms better or worse     History reviewed. No pertinent past medical history.  There are no active problems to display for this patient.   Past Surgical History:  Procedure Laterality Date  . ANKLE SURGERY       OB History   No obstetric history on file.      Home Medications    Prior to Admission medications   Medication Sig Start Date End Date Taking? Authorizing Provider  benzonatate (TESSALON) 100 MG capsule Take 1 capsule (100 mg total) by mouth 3 (three) times daily as needed for cough. 07/10/17   Petrucelli, Samantha R, PA-C  doxycycline (VIBRAMYCIN) 100 MG capsule Take 1 capsule (100 mg total) by mouth 2 (two) times daily. One po bid x 7 days 11/05/14   Purvis Sheffield, MD  fluticasone [facility] 50 MCG/ACT nasal spray Place 1 spray into both nostrils daily. 07/10/17   Petrucelli, Samantha R, PA-C  ibuprofen (ADVIL,MOTRIN) 800 MG tablet Take 1 tablet (800 mg total) by mouth every 8 (eight) hours as needed. 07/10/17   Petrucelli, Samantha R, PA-C  naproxen (NAPROSYN) 500 MG tablet Take 1 tablet (500 mg total) by mouth 2 (two) times daily as needed for mild pain, moderate pain or headache (TAKE WITH MEALS.). Patient not taking: Reported on 11/05/2014 09/18/14   Street, [place], PA-C  penicillin v potassium (VEETID) 500 MG tablet Take 1 tablet (500  mg total) by mouth 4 (four) times daily. 06/07/17   Lawyer, Cristal Deer, PA-C  traMADol (ULTRAM) 50 MG tablet Take 1 tablet (50 mg total) by mouth every 6 (six) hours as needed for severe pain. 06/07/17   Charlestine Night, PA-C    Family History No family history on file.  Social History Social History   Tobacco Use  . Smoking status: Current Every Day Smoker    Packs/day: 0.50    Types: Cigarettes  . Smokeless tobacco: Never Used  Substance Use Topics  . Alcohol use: Yes    Comment: occasional  . Drug use: No     Allergies   Patient has no known allergies.   Review of Systems Review of Systems  All other systems reviewed and are negative.    Physical Exam Updated Vital Signs BP 111/69   Pulse 86   Temp 98 F (36.7 C) (Oral)   Resp 16   SpO2 98%   Physical Exam Vitals signs and nursing note reviewed.  Constitutional:      General: She is not in acute distress.    Appearance: Normal appearance. She is well-developed. She is not toxic-appearing.  HENT:     Head: Normocephalic and atraumatic.  Eyes:     General: Lids are normal.     Conjunctiva/sclera: Conjunctivae normal.     Pupils: Pupils are equal, round, and  reactive to light.  Neck:     Musculoskeletal: Normal range of motion and neck supple.     Thyroid: No thyroid mass.     Trachea: No tracheal deviation.  Cardiovascular:     Rate and Rhythm: Normal rate and regular rhythm.     Heart sounds: Normal heart sounds. No murmur. No gallop.   Pulmonary:     Effort: Pulmonary effort is normal. No respiratory distress.     Breath sounds: Normal breath sounds. No stridor. No decreased breath sounds, wheezing, rhonchi or rales.  Abdominal:     General: Bowel sounds are normal. There is no distension.     Palpations: Abdomen is soft.     Tenderness: There is no abdominal tenderness. There is no rebound.  Musculoskeletal: Normal range of motion.        General: No tenderness.  Skin:    General: Skin is  warm and dry.     Findings: No abrasion or rash.  Neurological:     Mental Status: She is alert and oriented to person, place, and time.     GCS: GCS eye subscore is 4. GCS verbal subscore is 5. GCS motor subscore is 6.     Cranial Nerves: No cranial nerve deficit.     Sensory: No sensory deficit.  Psychiatric:        Speech: Speech normal.        Behavior: Behavior normal.      ED Treatments / Results  Labs (all labs ordered are listed, but only abnormal results are displayed) Labs Reviewed  CBC WITH DIFFERENTIAL/PLATELET  BASIC METABOLIC PANEL    EKG None  Radiology No results found.  Procedures Procedures (including critical care time)  Medications Ordered in ED Medications - No data to display   Initial Impression / Assessment and Plan / ED Course  I have reviewed the triage vital signs and the nursing notes.  Pertinent labs & imaging results that were available during my care of the patient were reviewed by me and considered in my medical decision making (see chart for details).        Labs and x-rays are reassuring here.  Patient be discharged  Final Clinical Impressions(s) / ED Diagnoses   Final diagnoses:  SOB (shortness of breath)    ED Discharge Orders    None       Lorre Nick, MD 10/16/18 1021

## 2018-10-16 NOTE — ED Triage Notes (Signed)
Pt states she has not felt well for the past 3 weeks. Pt states she has had diarrhea, denies fever. Pt states she has been around several people who have had the flu.

## 2020-08-08 HISTORY — PX: BREAST BIOPSY: SHX20

## 2020-08-08 HISTORY — PX: BREAST LUMPECTOMY: SHX2

## 2020-10-29 ENCOUNTER — Encounter (HOSPITAL_COMMUNITY): Payer: Self-pay

## 2020-10-29 ENCOUNTER — Other Ambulatory Visit: Payer: Self-pay

## 2020-10-29 ENCOUNTER — Ambulatory Visit (HOSPITAL_COMMUNITY)
Admission: EM | Admit: 2020-10-29 | Discharge: 2020-10-29 | Disposition: A | Discharge status: Home / Self Care | Payer: Self-pay | Attending: Internal Medicine | Admitting: Internal Medicine

## 2020-10-29 DIAGNOSIS — Z20822 Contact with and (suspected) exposure to covid-19: Secondary | ICD-10-CM | POA: Insufficient documentation

## 2020-10-29 DIAGNOSIS — F17210 Nicotine dependence, cigarettes, uncomplicated: Secondary | ICD-10-CM | POA: Insufficient documentation

## 2020-10-29 DIAGNOSIS — R519 Headache, unspecified: Secondary | ICD-10-CM | POA: Insufficient documentation

## 2020-10-29 DIAGNOSIS — J028 Acute pharyngitis due to other specified organisms: Secondary | ICD-10-CM | POA: Insufficient documentation

## 2020-10-29 DIAGNOSIS — J029 Acute pharyngitis, unspecified: Secondary | ICD-10-CM

## 2020-10-29 DIAGNOSIS — B9789 Other viral agents as the cause of diseases classified elsewhere: Secondary | ICD-10-CM | POA: Insufficient documentation

## 2020-10-29 MED ORDER — ALBUTEROL SULFATE HFA 108 (90 BASE) MCG/ACT IN AERS: 1.0000 | INHALATION_SPRAY | Freq: Four times a day (QID) | RESPIRATORY_TRACT | 0 refills | Status: DC | PRN

## 2020-10-29 NOTE — Discharge Instructions (Addendum)
Please take your medications as directed Increase oral fluid intake Tylenol and/or Motrin as needed for pain and/or fever If symptoms worsens please return to urgent care to be reevaluated.

## 2020-10-29 NOTE — ED Triage Notes (Signed)
Pt presents with headache, nasal congestion and body aches x 1 day. NyQuil, DayQuil, ibuprofen and Tylenol gives some relief. Tylenol last dose, 2 hrs ago.  Pt requested COVID test.

## 2020-10-29 NOTE — ED Provider Notes (Addendum)
[facility]    CSN: [id number] Arrival date & time: 10/29/20  1119      History   Chief Complaint Chief Complaint  Patient presents with  . Headache  . Nasal Congestion    HPI Brittney Price is a 45 y.o. female comes to urgent care with 1 day history of a headache, nasal congestion and generalized body aches.  Patient denies any nausea or vomiting.  No diarrhea.  She is fully vaccinated against COVID-19 virus.  Positive family history of sick contacts-family members have similar symptoms.  Family members have so far tested negative for COVID-19.  Appetite is preserved.   Patient denies any shortness of breath at this time but she admits to having wheezing intermittently.  She is a smoker.  HPI  History reviewed. No pertinent past medical history.  There are no problems to display for this patient.   Past Surgical History:  Procedure Laterality Date  . ANKLE SURGERY      OB History   No obstetric history on file.      Home Medications    Prior to Admission medications   Medication Sig Start Date End Date Taking? Authorizing Provider  acetaminophen (TYLENOL) 500 MG tablet Take 500 mg by mouth every 6 (six) hours as needed.   Yes [provider]  albuterol (VENTOLIN HFA) 108 (90 Base) MCG/ACT inhaler Inhale 1-2 puffs into the lungs every 6 (six) hours as needed for wheezing or shortness of breath. 10/29/20  Yes Mang Hazelrigg, Britta Mccreedy, MD  ibuprofen (ADVIL) 200 MG tablet Take 200 mg by mouth every 6 (six) hours as needed.   Yes [provider]  Pseudoeph-Doxylamine-DM-APAP (DAYQUIL/NYQUIL COLD/FLU RELIEF PO) Take by mouth.   Yes [provider]  fluticasone (FLONASE) 50 MCG/ACT nasal spray Place 1 spray into both nostrils daily. Patient not taking: No sig reported 07/10/17 10/29/20  Petrucelli, Pleas Koch, PA-C    Family History History reviewed. No pertinent family history.  Social History Social History   Tobacco Use  . Smoking  status: Current Every Day Smoker    Packs/day: 0.50    Types: Cigarettes  . Smokeless tobacco: Never Used  Substance Use Topics  . Alcohol use: Yes    Comment: occasional  . Drug use: No     Allergies   Patient has no known allergies.   Review of Systems Review of Systems  Constitutional: Negative.   HENT: Positive for congestion.   Respiratory: Positive for cough. Negative for shortness of breath.   Musculoskeletal: Negative.   Neurological: Positive for headaches.  Psychiatric/Behavioral: Negative.      Physical Exam Triage Vital Signs ED Triage Vitals  Enc Vitals Group     BP 10/29/20 1159 (!) 113/59     Pulse Rate 10/29/20 1159 62     Resp 10/29/20 1159 16     Temp 10/29/20 1159 98 F (36.7 C)     Temp Source 10/29/20 1159 Oral     SpO2 10/29/20 1159 99 %     Weight --      Height --      Head Circumference --      Peak Flow --      Pain Score 10/29/20 1156 8     Pain Loc --      Pain Edu? --      Excl. in GC? --    No data found.  Updated Vital Signs BP (!) 113/59 (BP Location: Left Arm)   Pulse 62  Temp 98 F (36.7 C) (Oral)   Resp 16   LMP  (Within Weeks) Comment: 1 week  SpO2 99%   Visual Acuity Right Eye Distance:   Left Eye Distance:   Bilateral Distance:    Right Eye Near:   Left Eye Near:    Bilateral Near:     Physical Exam Vitals and nursing note reviewed.  Constitutional:      General: She is not in acute distress.    Appearance: She is not ill-appearing.  Cardiovascular:     Rate and Rhythm: Normal rate and regular rhythm.     Heart sounds: Normal heart sounds. No murmur heard. No friction rub.  Musculoskeletal:     Cervical back: Normal range of motion and neck supple.  Lymphadenopathy:     Cervical: No cervical adenopathy.  Neurological:     Mental Status: She is alert.     GCS: GCS eye subscore is 4. GCS verbal subscore is 5. GCS motor subscore is 6.      UC Treatments / Results  Labs (all labs ordered are  listed, but only abnormal results are displayed) Labs Reviewed  SARS CORONAVIRUS 2 (TAT 6-24 HRS)    EKG   Radiology No results found.  Procedures Procedures (including critical care time)  Medications Ordered in UC Medications - No data to display  Initial Impression / Assessment and Plan / UC Course  I have reviewed the triage vital signs and the nursing notes.  Pertinent labs & imaging results that were available during my care of the patient were reviewed by me and considered in my medical decision making (see chart for details).     1.  Viral pharyngitis: COVID-19 PCR test sent Increase oral fluid intake Albuterol inhaler as needed for wheezing. Please quarantine until COVID-19 test results are available Please sign up for MyChart so you can view your lab results. Return precautions given. Final Clinical Impressions(s) / UC Diagnoses   Final diagnoses:  Viral pharyngitis     Discharge Instructions     Please take your medications as directed Increase oral fluid intake Tylenol and/or Motrin as needed for pain and/or fever If symptoms worsens please return to urgent care to be reevaluated.    ED Prescriptions    Medication Sig Dispense Auth. Provider   albuterol (VENTOLIN HFA) 108 (90 Base) MCG/ACT inhaler Inhale 1-2 puffs into the lungs every 6 (six) hours as needed for wheezing or shortness of breath. 18 g Navarro Nine, Britta Mccreedy, MD     PDMP not reviewed this encounter.   Merrilee Jansky, MD 10/29/20 1400    Merrilee Jansky, MD 10/29/20 [phone]

## 2020-10-30 LAB — SARS CORONAVIRUS 2 (TAT 6-24 HRS): SARS Coronavirus 2: NEGATIVE

## 2020-12-14 ENCOUNTER — Emergency Department (HOSPITAL_COMMUNITY)
Admission: EM | Admit: 2020-12-14 | Discharge: 2020-12-14 | Disposition: A | Discharge status: Home / Self Care | Payer: Medicaid Other | Attending: Emergency Medicine | Admitting: Emergency Medicine

## 2020-12-14 ENCOUNTER — Encounter (HOSPITAL_COMMUNITY): Payer: Self-pay | Admitting: Pharmacy Technician

## 2020-12-14 ENCOUNTER — Other Ambulatory Visit: Payer: Self-pay

## 2020-12-14 DIAGNOSIS — F17210 Nicotine dependence, cigarettes, uncomplicated: Secondary | ICD-10-CM | POA: Insufficient documentation

## 2020-12-14 DIAGNOSIS — N6310 Unspecified lump in the right breast, unspecified quadrant: Secondary | ICD-10-CM | POA: Insufficient documentation

## 2020-12-14 DIAGNOSIS — N63 Unspecified lump in unspecified breast: Secondary | ICD-10-CM

## 2020-12-14 NOTE — ED Provider Notes (Signed)
Emergency Medicine Provider Triage Evaluation Note  Brittney Price , a 45 y.o. female  was evaluated in triage.  Pt complains of right breast pain, mass, noticed today when scratching chest.  Review of Systems  Positive: Breast mass Negative: Skin changes, nipple dc  Physical Exam  BP 109/64 (BP Location: Right Arm)   Pulse 79   Temp 98.8 F (37.1 C) (Oral)   Resp 17   SpO2 100%  Gen:   Awake, no distress   Resp:  Normal effort  MSK:   Moves extremities without difficulty  Other:    Medical Decision Making  Medically screening exam initiated at 3:31 PM.  Appropriate orders placed.  Brittney Price was informed that the remainder of the evaluation will be completed by another provider, this initial triage assessment does not replace that evaluation, and the importance of remaining in the ED until their evaluation is complete.  SW consulted to assist with PCP and mammo follow up.    Jeannie Fend, PA-C 12/14/20 1532    Bethann Berkshire, MD 12/15/20 [phone]

## 2020-12-14 NOTE — ED Triage Notes (Signed)
Pt here for evaluation of R sided breast lump. Pt states noticed lump today.

## 2020-12-14 NOTE — Discharge Instructions (Signed)
Please call the women's center to get an appointment as soon as possible for evaluation of your breast lump.  I would additionally recommend following up with a primary care doctor.  Recommend calling the community [facility] and wellness.  If you feel that your area of swelling worsens significantly, you develop redness, fever, come back to ER for reassessment.

## 2020-12-14 NOTE — ED Provider Notes (Signed)
[facility] EMERGENCY DEPARTMENT Provider Note   CSN: [id number] Arrival date & time: 12/14/20  1502     History No chief complaint on file.   Brittney Price is a 45 y.o. female.  Presents here with concern for breast lump.  Now reports that she noticed it today upon self-examination.  She denies any pain, no redness, no fever.  She denies prior history of any breast problems.  She does not have a primary care doctor, has not had a mammogram.  She does not have a gynecologist.  HPI     History reviewed. No pertinent past medical history.  There are no problems to display for this patient.   Past Surgical History:  Procedure Laterality Date  . ANKLE SURGERY       OB History   No obstetric history on file.     No family history on file.  Social History   Tobacco Use  . Smoking status: Current Every Day Smoker    Packs/day: 0.50    Types: Cigarettes  . Smokeless tobacco: Never Used  Substance Use Topics  . Alcohol use: Yes    Comment: occasional  . Drug use: No    Home Medications Prior to Admission medications   Medication Sig Start Date End Date Taking? Authorizing Provider  acetaminophen (TYLENOL) 500 MG tablet Take 500 mg by mouth every 6 (six) hours as needed.    [provider]  albuterol (VENTOLIN HFA) 108 (90 Base) MCG/ACT inhaler Inhale 1-2 puffs into the lungs every 6 (six) hours as needed for wheezing or shortness of breath. 10/29/20   Lamptey, Britta Mccreedy, MD  ibuprofen (ADVIL) 200 MG tablet Take 200 mg by mouth every 6 (six) hours as needed.    [provider]  Pseudoeph-Doxylamine-DM-APAP (DAYQUIL/NYQUIL COLD/FLU RELIEF PO) Take by mouth.    [provider]  fluticasone (FLONASE) 50 MCG/ACT nasal spray Place 1 spray into both nostrils daily. Patient not taking: No sig reported 07/10/17 10/29/20  Petrucelli, Pleas Koch, PA-C    Allergies    Patient has no known allergies.  Review of Systems   Review of  Systems  Constitutional: Negative for chills and fever.  HENT: Negative for ear pain and sore throat.   Eyes: Negative for pain and visual disturbance.  Respiratory: Negative for cough and shortness of breath.   Cardiovascular: Negative for chest pain and palpitations.  Gastrointestinal: Negative for abdominal pain and vomiting.  Genitourinary: Negative for dysuria and hematuria.  Musculoskeletal: Negative for arthralgias and back pain.  Skin: Negative for color change and rash.  Neurological: Negative for seizures and syncope.  All other systems reviewed and are negative.   Physical Exam Updated Vital Signs BP 110/62 (BP Location: Right Arm)   Pulse 71   Temp 97.9 F (36.6 C) (Oral)   Resp 18   SpO2 98%   Physical Exam Vitals and nursing note reviewed. Exam conducted with a chaperone present.  Constitutional:      General: She is not in acute distress.    Appearance: She is well-developed.  HENT:     Head: Normocephalic and atraumatic.  Eyes:     Conjunctiva/sclera: Conjunctivae normal.  Cardiovascular:     Rate and Rhythm: Normal rate and regular rhythm.     Heart sounds: No murmur heard.   Pulmonary:     Effort: Pulmonary effort is normal. No respiratory distress.  Chest:    Musculoskeletal:     Cervical back: Neck supple.  Skin:    General: Skin is warm and dry.     Capillary Refill: Capillary refill takes less than 2 seconds.  Neurological:     Mental Status: She is alert.  Psychiatric:        Mood and Affect: Mood normal.     ED Results / Procedures / Treatments   Labs (all labs ordered are listed, but only abnormal results are displayed) Labs Reviewed - No data to display  EKG None  Radiology No results found.  Procedures Procedures   Medications Ordered in ED Medications - No data to display  ED Course  I have reviewed the triage vital signs and the nursing notes.  Pertinent labs & imaging results that were available during my care of  the patient were reviewed by me and considered in my medical decision making (see chart for details).    MDM Rules/Calculators/A&P                         45 year old lady presents to ER with concern for lump on her right breast.  She denies any other associated symptoms.  On physical exam patient appears well.  Noted to have a small area of firm swelling.  No overlying erythema to suggest infection, no fever.  Not causing any pain.  Given the current appearance, believe the next appropriate step would be evaluation by gynecology and consideration for outpatient imaging and monitoring.  Patient does not have a primary care doctor or her primary gynecologist.  Recommended she contact gynecology and primary care.  Contacted our case manager to assist patient.  Reviewed return precautions and discharged home.   After the discussed management above, the patient was determined to be safe for discharge.  The patient was in agreement with this plan and all questions regarding their care were answered.  ED return precautions were discussed and the patient will return to the ED with any significant worsening of condition.   Final Clinical Impression(s) / ED Diagnoses Final diagnoses:  Breast lump    Rx / DC Orders ED Discharge Orders    None       Milagros Loll, MD 12/15/20 1807

## 2020-12-16 ENCOUNTER — Other Ambulatory Visit: Payer: Self-pay

## 2020-12-16 ENCOUNTER — Telehealth: Payer: Self-pay | Admitting: Surgery

## 2020-12-16 DIAGNOSIS — N63 Unspecified lump in unspecified breast: Secondary | ICD-10-CM

## 2020-12-16 DIAGNOSIS — N6310 Unspecified lump in the right breast, unspecified quadrant: Secondary | ICD-10-CM

## 2020-12-16 NOTE — Care Management (Signed)
ED CM consulted to assist patient with follow up to breast clinic and establsihing follow up care with [facility]. Patient is uninsured, met with patient to discuss transitional care planning, patient is agreeable with  plan.  Explained to patient verbally and information also placed on AVS.

## 2021-01-12 ENCOUNTER — Ambulatory Visit
Admission: RE | Admit: 2021-01-12 | Discharge: 2021-01-12 | Disposition: A | Discharge status: Home / Self Care | Payer: Medicaid Other | Source: Ambulatory Visit | Attending: Obstetrics and Gynecology | Admitting: Obstetrics and Gynecology

## 2021-01-12 ENCOUNTER — Other Ambulatory Visit: Payer: Self-pay | Admitting: Obstetrics and Gynecology

## 2021-01-12 ENCOUNTER — Ambulatory Visit: Payer: No Typology Code available for payment source | Admitting: *Deleted

## 2021-01-12 ENCOUNTER — Other Ambulatory Visit: Payer: Self-pay

## 2021-01-12 ENCOUNTER — Ambulatory Visit
Admission: RE | Admit: 2021-01-12 | Discharge: 2021-01-12 | Disposition: A | Discharge status: Home / Self Care | Payer: No Typology Code available for payment source | Source: Ambulatory Visit | Attending: Obstetrics and Gynecology | Admitting: Obstetrics and Gynecology

## 2021-01-12 VITALS — BP 110/70 | Wt 153.0 lb

## 2021-01-12 DIAGNOSIS — N6310 Unspecified lump in the right breast, unspecified quadrant: Secondary | ICD-10-CM

## 2021-01-12 DIAGNOSIS — R921 Mammographic calcification found on diagnostic imaging of breast: Secondary | ICD-10-CM

## 2021-01-12 DIAGNOSIS — Z01419 Encounter for gynecological examination (general) (routine) without abnormal findings: Secondary | ICD-10-CM

## 2021-01-12 DIAGNOSIS — N6320 Unspecified lump in the left breast, unspecified quadrant: Secondary | ICD-10-CM

## 2021-01-12 DIAGNOSIS — N6315 Unspecified lump in the right breast, overlapping quadrants: Secondary | ICD-10-CM

## 2021-01-12 NOTE — Progress Notes (Signed)
Brittney Price is a 45 y.o. female who presents to [facility] clinic today with complaint of right breast lump since 12/13/2020 that was painful for one week then the pain resolved unless it was pushed on. Patient rated the pain at a 7 out of 10.    Pap Smear: Pap smear completed today. Last Pap smear was 14 years ago and was normal per patient. Per patient has no history of an abnormal Pap smear. Last Pap smear result is not available in Epic.   Physical exam: Breasts Breasts symmetrical. No skin abnormalities bilateral breasts. No nipple retraction left breast. Slight nipple inversion right breast that per patient has been a change since noticed lump. No nipple discharge bilateral breasts. No lymphadenopathy. No lumps palpated left breast. Palpated a lump within the right breast at 3 o'clock 1.5 cm from the nipple. Complaints of tenderness when palpated right breast lump and nipple area on exam.   Pelvic/Bimanual Ext Genitalia No lesions, no swelling and no discharge observed on external genitalia.        Vagina Vagina pink and normal texture. No lesions or discharge observed in vagina.        Cervix Cervix is present. Cervix pink and reddened around os. Cervix friable. No discharge observed.    Uterus Uterus is present and palpable. Uterus in normal position and normal size.        Adnexae Bilateral ovaries present and palpable. No tenderness on palpation.         Rectovaginal No rectal exam completed today since patient had no rectal complaints. No skin abnormalities observed on exam.     Smoking History: Patient is a current smoker. Discussed smoking cessation with patient. Referred to the [facility] Quitline and gave resources to the free smoking cessation classes at [facility].   Patient Navigation: Patient education provided. Access to services provided for patient through [facility] program.   Colorectal Cancer Screening: Per patient has never had colonoscopy completed. No complaints  today.    Breast and Cervical Cancer Risk Assessment: Patient has family history of her maternal grandmother having breast cancer. Patient has no known genetic mutations or history of radiation treatment to the chest before age 75. Patient does not have history of cervical dysplasia, immunocompromised, or DES exposure in-utero.  Risk Assessment    Risk Scores      01/12/2021   Last edited by: Narda Rutherford, LPN   5-year risk: 0.7 %   Lifetime risk: 7.6 %          A: [facility] exam with pap smear Complaint of right breast lump.  P: Referred patient to the [facility] for a diagnostic mammogram. Appointment scheduled Tuesday, January 12, 2021 at 1310.   Priscille Heidelberg, RN 01/12/2021 11:30 AM

## 2021-01-12 NOTE — Patient Instructions (Addendum)
Explained breast self awareness with Deforest Hoyles. Pap smear completed today. Let her know [facility] will cover Pap smears and HPV typing every 5 years unless has a history of abnormal Pap smears. Referred patient to the [facility] for a diagnostic mammogram. Appointment scheduled Tuesday, January 12, 2021 at 1310. Patient aware of appointment and will be there. Let patient know will follow-up with her within the next couple of weeks with results of her Pap smear by letter or phone. Discussed smoking cessation with patient. Referred to the [facility] Quitline and gave resources to the free smoking cessation classes at [facility]. Deforest Hoyles verbalized understanding.  Twylia Oka, Kathaleen Maser, RN 11:30 AM

## 2021-01-13 LAB — CYTOLOGY - PAP
Comment: NEGATIVE
Diagnosis: NEGATIVE
High risk HPV: NEGATIVE

## 2021-01-14 ENCOUNTER — Telehealth: Payer: Self-pay

## 2021-01-14 NOTE — Telephone Encounter (Signed)
Attempted to contact patient regarding lab results (Pap/HPV). Left message on voicemail requesting return call.

## 2021-01-15 ENCOUNTER — Other Ambulatory Visit: Payer: Self-pay

## 2021-01-15 ENCOUNTER — Other Ambulatory Visit: Payer: Self-pay | Admitting: Obstetrics and Gynecology

## 2021-01-15 ENCOUNTER — Ambulatory Visit
Admission: RE | Admit: 2021-01-15 | Discharge: 2021-01-15 | Disposition: A | Discharge status: Home / Self Care | Payer: No Typology Code available for payment source | Source: Ambulatory Visit | Attending: Obstetrics and Gynecology | Admitting: Obstetrics and Gynecology

## 2021-01-15 DIAGNOSIS — R921 Mammographic calcification found on diagnostic imaging of breast: Secondary | ICD-10-CM

## 2021-01-16 ENCOUNTER — Other Ambulatory Visit: Payer: Self-pay

## 2021-01-16 ENCOUNTER — Emergency Department (HOSPITAL_COMMUNITY): Payer: Medicaid Other

## 2021-01-16 ENCOUNTER — Emergency Department (HOSPITAL_COMMUNITY)
Admission: EM | Admit: 2021-01-16 | Discharge: 2021-01-16 | Disposition: A | Discharge status: Home / Self Care | Payer: Medicaid Other | Attending: Emergency Medicine | Admitting: Emergency Medicine

## 2021-01-16 ENCOUNTER — Encounter (HOSPITAL_COMMUNITY): Payer: Self-pay | Admitting: Emergency Medicine

## 2021-01-16 DIAGNOSIS — F17210 Nicotine dependence, cigarettes, uncomplicated: Secondary | ICD-10-CM | POA: Insufficient documentation

## 2021-01-16 DIAGNOSIS — R911 Solitary pulmonary nodule: Secondary | ICD-10-CM

## 2021-01-16 DIAGNOSIS — R0789 Other chest pain: Secondary | ICD-10-CM | POA: Diagnosis not present

## 2021-01-16 DIAGNOSIS — R0602 Shortness of breath: Secondary | ICD-10-CM | POA: Insufficient documentation

## 2021-01-16 DIAGNOSIS — R001 Bradycardia, unspecified: Secondary | ICD-10-CM | POA: Insufficient documentation

## 2021-01-16 DIAGNOSIS — R072 Precordial pain: Secondary | ICD-10-CM

## 2021-01-16 LAB — CBC
HCT: 40.8 % (ref 36.0–46.0)
Hemoglobin: 13.7 g/dL (ref 12.0–15.0)
MCH: 32.2 pg (ref 26.0–34.0)
MCHC: 33.6 g/dL (ref 30.0–36.0)
MCV: 95.8 fL (ref 80.0–100.0)
Platelets: 139 10*3/uL — ABNORMAL LOW (ref 150–400)
RBC: 4.26 MIL/uL (ref 3.87–5.11)
RDW: 11.6 % (ref 11.5–15.5)
WBC: 7.4 10*3/uL (ref 4.0–10.5)
nRBC: 0.3 % — ABNORMAL HIGH (ref 0.0–0.2)

## 2021-01-16 LAB — HCG, QUANTITATIVE, PREGNANCY: hCG, Beta Chain, Quant, S: <1 m[IU]/mL (ref <5)

## 2021-01-16 LAB — BASIC METABOLIC PANEL
Anion gap: 6 (ref 5–15)
BUN: 10 mg/dL (ref 6–20)
CO2: 24 mmol/L (ref 22–32)
Calcium: 8.8 mg/dL — ABNORMAL LOW (ref 8.9–10.3)
Chloride: 109 mmol/L (ref 98–111)
Creatinine, Ser: 0.62 mg/dL (ref 0.44–1.00)
GFR, Estimated: >60 mL/min (ref >60)
Glucose, Bld: 124 mg/dL — ABNORMAL HIGH (ref 70–99)
Potassium: 3.9 mmol/L (ref 3.5–5.1)
Sodium: 139 mmol/L (ref 135–145)

## 2021-01-16 LAB — D-DIMER, QUANTITATIVE: D-Dimer, Quant: 0.51 ug/mL-FEU — ABNORMAL HIGH (ref 0.00–0.50)

## 2021-01-16 LAB — TROPONIN I (HIGH SENSITIVITY)
Troponin I (High Sensitivity): 3 ng/L (ref <18)
Troponin I (High Sensitivity): 4 ng/L (ref <18)

## 2021-01-16 MED ORDER — SODIUM CHLORIDE 0.9 % IV BOLUS
500.0000 mL | Freq: Once | INTRAVENOUS | Status: AC
  Administered 2021-01-16: 500 mL via INTRAVENOUS

## 2021-01-16 MED ORDER — FENTANYL CITRATE (PF) 100 MCG/2ML IJ SOLN
50.0000 ug | Freq: Once | INTRAMUSCULAR | Status: AC
  Administered 2021-01-16: 50 ug via INTRAVENOUS
  Filled 2021-01-16: qty 2

## 2021-01-16 MED ORDER — IOHEXOL 350 MG/ML SOLN
100.0000 mL | Freq: Once | INTRAVENOUS | Status: AC | PRN
  Administered 2021-01-16: 100 mL via INTRAVENOUS

## 2021-01-16 MED ORDER — ONDANSETRON HCL 4 MG/2ML IJ SOLN
4.0000 mg | Freq: Once | INTRAMUSCULAR | Status: AC
  Administered 2021-01-16: 4 mg via INTRAVENOUS
  Filled 2021-01-16: qty 2

## 2021-01-16 MED ORDER — KETOROLAC TROMETHAMINE 15 MG/ML IJ SOLN
15.0000 mg | Freq: Once | INTRAMUSCULAR | Status: AC
  Administered 2021-01-16: 15 mg via INTRAVENOUS
  Filled 2021-01-16: qty 1

## 2021-01-16 MED ORDER — SODIUM CHLORIDE (PF) 0.9 % IJ SOLN
INTRAMUSCULAR | Status: AC
  Filled 2021-01-16: qty 50

## 2021-01-16 NOTE — Discharge Instructions (Addendum)
Please read and follow all provided instructions.  Your diagnoses today include:  1. Precordial pain   2. Pulmonary nodule     Tests performed today include: An EKG of your heart A chest x-ray Cardiac enzymes - a blood test for heart muscle damage Blood counts and electrolytes D-dimer test for blood clot - was slightly high CT scan of the chest - did not show any blood clots, you do have a very small pulmonary nodule that will need to be followed by your primary care doctor.  Vital signs. See below for your results today.   Medications prescribed:  None  Take any prescribed medications only as directed.  Follow-up instructions: Please follow-up with your primary care provider as soon as you can for further evaluation of your symptoms.   Return instructions:  SEEK IMMEDIATE MEDICAL ATTENTION IF: You have severe chest pain, especially if the pain is crushing or pressure-like and spreads to the arms, back, neck, or jaw, or if you have sweating, nausea (feeling sick to your stomach), or shortness of breath. THIS IS AN EMERGENCY. Don't wait to see if the pain will go away. Get medical help at once. Call 911 or [phone] (operator). DO NOT drive yourself to the hospital.  Your chest pain gets worse and does not go away with rest.  You have an attack of chest pain lasting longer than usual, despite rest and treatment with the medications your caregiver has prescribed.  You wake from sleep with chest pain or shortness of breath. You feel dizzy or faint. You have chest pain not typical of your usual pain for which you originally saw your caregiver.  You have any other emergent concerns regarding your health.  Additional Information: Chest pain comes from many different causes. Your caregiver has diagnosed you as having chest pain that is not specific for one problem, but does not require admission.  You are at low risk for an acute heart condition or other serious illness.   Your vital signs  today were: BP 117/72   Pulse 63   Temp 98 F (36.7 C) (Oral)   Resp 16   Ht 5' 8.5" (1.74 m)   Wt 69.4 kg   LMP 01/05/2021 (Exact Date)   SpO2 100%   BMI 22.93 kg/m  If your blood pressure (BP) was elevated above 135/85 this visit, please have this repeated by your doctor within one month. --------------

## 2021-01-16 NOTE — ED Provider Notes (Signed)
Emergency Medicine Provider Triage Evaluation Note  Brittney Price , a 45 y.o. female  was evaluated in triage.  Pt complains of left-sided chest pain.  Reports waking this morning at approximately 0 730 with left-sided chest pain.  Pain has been constant since then.  No radiation of pain.  Patient endorses associated shortness of breath.  Patient denies any nausea, vomiting, or diaphoresis.  Pain is worse with movement and deep inhalation.  Patient endorses smoking history.  Review of Systems  Positive: Left-sided chest pain, shortness of breath Negative: Nausea, vomiting, diaphoresis  Physical Exam  BP 116/84 (BP Location: Left Arm)   Pulse 63   Temp 98 F (36.7 C) (Oral)   Resp (!) 22   LMP 01/05/2021 (Exact Date)   SpO2 100%  Gen:   Awake, no distress   Resp:  Normal effort, lungs clear to auscultation bilaterally MSK:   Moves extremities without difficulty  Other:  Tenderness to chest wall  Medical Decision Making  Medically screening exam initiated at 10:35 AM.  Appropriate orders placed.  Brittney Price was informed that the remainder of the evaluation will be completed by another provider, this initial triage assessment does not replace that evaluation, and the importance of remaining in the ED until their evaluation is complete.  The patient appears stable so that the remainder of the work up may be completed by another provider.      Haskel Schroeder, PA-C 01/16/21 1037    Mancel Bale, MD 01/16/21 1148

## 2021-01-16 NOTE — ED Provider Notes (Signed)
[facility]-EMERGENCY DEPT Provider Note   CSN: [id number] Arrival date & time: 01/16/21  0950     History Chief Complaint  Patient presents with   Chest Pain   Shortness of Breath    Brittney Price is a 45 y.o. female.  Patient with no significant past medical history presents the emergency department today for evaluation of left-sided chest pain.  Symptoms started when she awoke this morning around 7:30 AM.  Patient describes a sharp pain in her left and left lateral chest wall which is worse with deep breathing and movement of her arm.  She reports subjective shortness of breath.  No associated vomiting or diaphoresis.  Symptoms are nonexertional.  She had a breast biopsy performed yesterday after there is an abnormal spot on the mammogram.  This was on the right side.  She called her provider who did not think that it was related.  Patient denies risk factors for pulmonary embolism including: unilateral leg swelling, history of DVT/PE/other blood clots, use of exogenous hormones, recent immobilizations, recent surgery, recent travel (>4hr segment), malignancy, hemoptysis.  No abdominal pain.  No recent respiratory illnesses reported.  No current fever or cough.  She took some Tylenol which did not really help.  No history of hypertension, high cholesterol, diabetes.  She is a current smoker. The onset of this condition was acute. The course is constant. Aggravating factors: movement. Alleviating factors: none.          History reviewed. No pertinent past medical history.  There are no problems to display for this patient.   Past Surgical History:  Procedure Laterality Date   ANKLE SURGERY       OB History   No obstetric history on file.     Family History  Problem Relation Age of Onset   Breast cancer Maternal Grandmother     Social History   Tobacco Use   Smoking status: Every Day    Packs/day: 0.50    Pack years: 0.00    Types: Cigarettes    Smokeless tobacco: Never  Vaping Use   Vaping Use: Never used  Substance Use Topics   Alcohol use: Yes    Comment: occasional   Drug use: No    Home Medications Prior to Admission medications   Medication Sig Start Date End Date Taking? Authorizing Provider  acetaminophen (TYLENOL) 500 MG tablet Take 500 mg by mouth every 6 (six) hours as needed.    [provider]  albuterol (VENTOLIN HFA) 108 (90 Base) MCG/ACT inhaler Inhale 1-2 puffs into the lungs every 6 (six) hours as needed for wheezing or shortness of breath. 10/29/20   Lamptey, Britta Mccreedy, MD  ibuprofen (ADVIL) 200 MG tablet Take 200 mg by mouth every 6 (six) hours as needed.    [provider]  Pseudoeph-Doxylamine-DM-APAP (DAYQUIL/NYQUIL COLD/FLU RELIEF PO) Take by mouth.    [provider]  fluticasone (FLONASE) 50 MCG/ACT nasal spray Place 1 spray into both nostrils daily. Patient not taking: No sig reported 07/10/17 10/29/20  Petrucelli, Pleas Koch, PA-C    Allergies    Patient has no known allergies.  Review of Systems   Review of Systems  Constitutional:  Negative for diaphoresis and fever.  Eyes:  Negative for redness.  Respiratory:  Positive for shortness of breath. Negative for cough.   Cardiovascular:  Positive for chest pain. Negative for palpitations and leg swelling.  Gastrointestinal:  Negative for abdominal pain, nausea and vomiting.  Genitourinary:  Negative  for dysuria.  Musculoskeletal:  Negative for back pain and neck pain.  Skin:  Negative for rash.  Neurological:  Negative for syncope and light-headedness.  Psychiatric/Behavioral:  The patient is not nervous/anxious.    Physical Exam Updated Vital Signs BP 116/84 (BP Location: Left Arm)   Pulse 63   Temp 98 F (36.7 C) (Oral)   Resp (!) 22   LMP 01/05/2021 (Exact Date)   SpO2 100%   Physical Exam Vitals and nursing note reviewed.  Constitutional:      Appearance: She is well-developed. She is not diaphoretic.   HENT:     Head: Normocephalic and atraumatic.     Mouth/Throat:     Mouth: Mucous membranes are not dry.  Eyes:     Conjunctiva/sclera: Conjunctivae normal.  Neck:     Vascular: Normal carotid pulses. No JVD.     Trachea: Trachea normal. No tracheal deviation.  Cardiovascular:     Rate and Rhythm: Regular rhythm. Bradycardia present.     Pulses: No decreased pulses.          Radial pulses are 2+ on the right side and 2+ on the left side.     Heart sounds: Normal heart sounds, S1 normal and S2 normal. No murmur heard. Pulmonary:     Effort: Pulmonary effort is normal. No respiratory distress.     Breath sounds: No wheezing.  Chest:     Chest wall: Tenderness (mild, left upper chest wall) present.  Abdominal:     General: Bowel sounds are normal.     Palpations: Abdomen is soft.     Tenderness: There is no abdominal tenderness. There is no guarding or rebound.  Musculoskeletal:        General: Normal range of motion.     Cervical back: Normal range of motion and neck supple. No muscular tenderness.  Skin:    General: Skin is warm and dry.     Coloration: Skin is not pale.  Neurological:     Mental Status: She is alert.    ED Results / Procedures / Treatments   Labs (all labs ordered are listed, but only abnormal results are displayed) Labs Reviewed  BASIC METABOLIC PANEL - Abnormal; Notable for the following components:      Result Value   Glucose, Bld 124 (*)    Calcium 8.8 (*)    All other components within normal limits  CBC - Abnormal; Notable for the following components:   Platelets 139 (*)    nRBC 0.3 (*)    All other components within normal limits  HCG, QUANTITATIVE, PREGNANCY  D-DIMER, QUANTITATIVE  TROPONIN I (HIGH SENSITIVITY)  TROPONIN I (HIGH SENSITIVITY)    ED ECG REPORT   Date: 01/16/2021  Rate: 61  Rhythm: normal sinus rhythm  QRS Axis: normal  Intervals: normal  ST/T Wave abnormalities: normal  Conduction Disutrbances:none  Narrative  Interpretation:   Old EKG Reviewed: unchanged  I have personally reviewed the EKG tracing and agree with the computerized printout as noted.   Radiology DG Chest 2 View  Result Date: 01/16/2021 CLINICAL DATA:  Left chest pain and shortness of breath EXAM: CHEST - 2 VIEW COMPARISON:  Prior chest x-ray 10/16/2018 FINDINGS: The heart size and mediastinal contours are within normal limits. Both lungs are clear. The visualized skeletal structures are unremarkable. IMPRESSION: Negative chest x-ray. Electronically Signed   By: Malachy Moan M.D.   On: 01/16/2021 11:26   CT Angio Chest PE W and/or Wo Contrast  Result Date: 01/16/2021 CLINICAL DATA:  L chest pain and SOB today. Reports had R breast biopsy yesterday. +d-Dimer EXAM: CT ANGIOGRAPHY CHEST WITH CONTRAST TECHNIQUE: Multidetector CT imaging of the chest was performed using the standard protocol during bolus administration of intravenous contrast. Multiplanar CT image reconstructions and MIPs were obtained to evaluate the vascular anatomy. CONTRAST:  OMNIPAQUE IOHEXOL 350 MG/ML SOLN COMPARISON:  Chest x-ray 01/16/2021 FINDINGS: Cardiovascular: Satisfactory opacification of the pulmonary arteries to the segmental level. No evidence of pulmonary embolism. The main pulmonary artery is normal in caliber. Normal heart size. No significant pericardial effusion. The thoracic aorta is normal in caliber. No atherosclerotic plaque of the thoracic aorta. No coronary artery calcifications. Mediastinum/Nodes: No enlarged mediastinal, hilar, or axillary lymph nodes. Thyroid gland, trachea, and esophagus demonstrate no significant findings. Lungs/Pleura: 3 mm pulmonary nodule within the right lower lobe (6:93). Bilateral lower lobe subsegmental atelectasis. No pulmonary mass. No focal consolidation. No pleural effusion. No pneumothorax. Upper Abdomen: No acute abnormality. Musculoskeletal: Nonspecific coarse calcification within the right breast (4:66). No  suspicious lytic or blastic osseous lesions. No acute displaced fracture. Multilevel degenerative changes of the spine. Review of the MIP images confirms the above findings. IMPRESSION: 1. No pulmonary embolus. 2. No acute intrathoracic abnormality. Electronically Signed   By: Tish Frederickson M.D.   On: 01/16/2021 16:57   MM CLIP PLACEMENT RIGHT  Result Date: 01/15/2021 CLINICAL DATA:  Patient is post stereotactic core needle biopsy of a 1.2 cm group of indeterminate microcalcifications over the upper central right breast. EXAM: DIAGNOSTIC RIGHT MAMMOGRAM POST STEREOTACTIC BIOPSY COMPARISON:  Previous exam(s). FINDINGS: Mammographic images were obtained following stereotactic guided biopsy of the targeted microcalcifications over the upper central right breast. The biopsy marking clip is in expected position at the site of biopsy. IMPRESSION: Appropriate positioning of the X shaped biopsy marking clip at the site of biopsy in the upper central right breast. Final Assessment: Post Procedure Mammograms for Marker Placement Electronically Signed   By: Elberta Fortis M.D.   On: 01/15/2021 13:34   MM RT BREAST BX W LOC DEV 1ST LESION IMAGE BX SPEC STEREO GUIDE  Result Date: 01/15/2021 CLINICAL DATA:  Patient presents for stereotactic core needle biopsy of a 1.2 cm group of indeterminate microcalcifications over the upper central right breast. EXAM: RIGHT BREAST STEREOTACTIC CORE NEEDLE BIOPSY COMPARISON:  Previous exams. FINDINGS: The patient and I discussed the procedure of stereotactic-guided biopsy including benefits and alternatives. We discussed the high likelihood of a successful procedure. We discussed the risks of the procedure including infection, bleeding, tissue injury, clip migration, and inadequate sampling. Informed written consent was given. The usual time out protocol was performed immediately prior to the procedure. Using sterile technique and 1% Lidocaine as local anesthetic, under stereotactic  guidance, a 9 gauge vacuum assisted device was used to perform core needle biopsy of the targeted microcalcifications over the upper central right breast using a superior to inferior approach. Specimen radiograph was performed showing multiple of the targeted microcalcifications. Specimens with calcifications are identified for pathology. Lesion quadrant: Right upper inner quadrant (12-12:30 position). At the conclusion of the procedure, X shaped tissue marker clip was deployed into the biopsy cavity. Follow-up 2-view mammogram was performed and dictated separately. IMPRESSION: Stereotactic-guided biopsy of indeterminate right breast microcalcifications. No apparent complications. Electronically Signed   By: Elberta Fortis M.D.   On: 01/15/2021 13:26    Procedures Procedures   Medications Ordered in ED Medications - No data to display  ED Course  I have reviewed the triage vital signs and the nursing notes.  Pertinent labs & imaging results that were available during my care of the patient were reviewed by me and considered in my medical decision making (see chart for details).  Patient seen and examined.  Vital signs are reassuring.  Patient is low risk for PE.  She does report shortness of breath without hypoxia, pleuritic type of pain.  She had a biopsy done yesterday for a right sided breast abnormality.  Discussed testing with D-dimer with patient and she would like to proceed.  First troponin negative, pending second marker.  EKG reviewed.  Symptoms are somewhat reproducible with palpation.  Vital signs reviewed and are as follows: BP 116/84 (BP Location: Left Arm)   Pulse 63   Temp 98 F (36.7 C) (Oral)   Resp (!) 22   LMP 01/05/2021 (Exact Date)   SpO2 100%   6:06 PM CT was negative for PE.  No other concerning findings.  She does have a small right lower lobe pulmonary nodule that she was made aware of.  Discussed that she will need to follow-up with PCP regarding this for potential  monitoring.  Otherwise, she is feeling better after receiving pain medication.  She will continue over-the-counter medications at home.  Encouraged use of heat and rest on the area as needed.  Patient was counseled to return with severe chest pain, especially if the pain is crushing or pressure-like and spreads to the arms, back, neck, or jaw, or if they have sweating, nausea, or shortness of breath with the pain. They were encouraged to call 911 with these symptoms.   The patient verbalized understanding and agreed.     MDM Rules/Calculators/A&P                          Patient presents with chest pain today after recent breast biopsy.  She was evaluated for ACS and had a reassuring EKG, normal chest x-ray, troponin negative x2.  D-dimer was slightly elevated.  CTA of the chest was negative for PE.  Suspect possible chest wall pain.  Symptoms now improved.  She looks well.  Vital signs are stable.  Plan for discharge to home.    Final Clinical Impression(s) / ED Diagnoses Final diagnoses:  Precordial pain  Pulmonary nodule    Rx / DC Orders ED Discharge Orders     None        Renne CriglerGeiple, Akul Leggette, PA-C 01/16/21 1808    Gwyneth SproutPlunkett, Whitney, MD 01/17/21 1611

## 2021-01-16 NOTE — ED Triage Notes (Signed)
Patient c/o L chest pain and SOB today. Reports had R breast biopsy yesterday.

## 2021-01-18 ENCOUNTER — Telehealth: Payer: Self-pay

## 2021-01-18 NOTE — Telephone Encounter (Signed)
Patient informed negative Pap/HPV results, next pap test due in 5 years. Patient verbalized understanding.

## 2021-01-20 ENCOUNTER — Telehealth: Payer: Self-pay | Admitting: Hematology and Oncology

## 2021-01-20 NOTE — Telephone Encounter (Signed)
Spoke to patient to confirm morning BC appointment for 6/22, packet sent via email 

## 2021-01-21 ENCOUNTER — Encounter: Payer: Self-pay | Admitting: *Deleted

## 2021-01-21 DIAGNOSIS — D0511 Intraductal carcinoma in situ of right breast: Secondary | ICD-10-CM | POA: Insufficient documentation

## 2021-01-26 NOTE — Progress Notes (Signed)
[facility] CONSULT NOTE  Patient Care Team: Patient, No Pcp Per (Inactive) as PCP - General (General Practice) Donnelly Angelica, RN as Oncology Nurse Navigator Pershing Proud, RN as Oncology Nurse Navigator Manus Rudd, MD as Consulting Physician (General Surgery) Serena Croissant, MD as Consulting Physician (Hematology and Oncology) Dorothy Puffer, MD as Consulting Physician (Radiation Oncology)  CHIEF COMPLAINTS/PURPOSE OF CONSULTATION:  Newly diagnosed DCIS of the right breast   HISTORY OF PRESENTING ILLNESS:  Brittney Price 45 y.o. female is here because of recent diagnosis of DCIS of the right breast. She had a palpable right breast lump. Diagnostic mammogram and [place] on 01/12/21 showed intermediate right breast calcifications, benign simple cysts in bilateral breast, one of which corresponds with her palpable right breast lump, and no evidence of malignancy bilaterally. Biopsy of the right breast on 01/15/21 showed ductal carcinoma in situ with calcification and necrosis, ER+/PR+ (>95%). She presents to the clinic today for initial evaluation and discussion of treatment options.   I reviewed her records extensively and collaborated the history with the patient.  SUMMARY OF ONCOLOGIC HISTORY: Oncology History  Ductal carcinoma in situ (DCIS) of right breast  01/15/2021 Initial Diagnosis   Palpable lump in right breast. Diagnostic mammogram and [place] on 01/12/21 showed intermediate right breast calcifications, benign simple cysts in bilateral breast, one of which corresponds with her palpable right breast lump, and no evidence of malignancy bilaterally. Biopsy of the right breast on 01/15/21 showed DCIS with calcification and necrosis, ER+/PR+ (>95%).     MEDICAL HISTORY:  Patient appears to have symptoms of depression  SURGICAL HISTORY: Past Surgical History:  Procedure Laterality Date   ANKLE SURGERY      SOCIAL HISTORY: Social History   Socioeconomic History    Marital status: Married    Spouse name: Not on file   Number of children: 3   Years of education: Not on file   Highest education level: 9th grade  Occupational History   Not on file  Tobacco Use   Smoking status: Every Day    Packs/day: 0.50    Pack years: 0.00    Types: Cigarettes   Smokeless tobacco: Never  Vaping Use   Vaping Use: Never used  Substance and Sexual Activity   Alcohol use: Yes    Comment: occasional   Drug use: No   Sexual activity: Yes  Other Topics Concern   Not on file  Social History Narrative   Not on file   Social Determinants of Health   Financial Resource Strain: Not on file  Food Insecurity: Not on file  Transportation Needs: No Transportation Needs   Lack of Transportation (Medical): No   Lack of Transportation (Non-Medical): No  Physical Activity: Not on file  Stress: Not on file  Social Connections: Not on file  Intimate Partner Violence: Not on file    FAMILY HISTORY: Family History  Problem Relation Age of Onset   Breast cancer Maternal Grandmother     ALLERGIES:  has No Known Allergies.  MEDICATIONS:  Current Outpatient Medications  Medication Sig Dispense Refill   acetaminophen (TYLENOL) 500 MG tablet Take 500 mg by mouth every 6 (six) hours as needed.     albuterol (VENTOLIN HFA) 108 (90 Base) MCG/ACT inhaler Inhale 1-2 puffs into the lungs every 6 (six) hours as needed for wheezing or shortness of breath. 18 g 0   ibuprofen (ADVIL) 200 MG tablet Take 200 mg by mouth every 6 (six) hours as needed.  Pseudoeph-Doxylamine-DM-APAP (DAYQUIL/NYQUIL COLD/FLU RELIEF PO) Take by mouth. (Patient not taking: Reported on 01/16/2021)     No current facility-administered medications for this visit.    REVIEW OF SYSTEMS:   Constitutional: Denies fevers, chills or abnormal night sweats Eyes: Denies blurriness of vision, double vision or watery eyes Ears, nose, mouth, throat, and face: Denies mucositis or sore throat Respiratory: Denies  cough, dyspnea or wheezes Cardiovascular: Denies palpitation, chest discomfort or lower extremity swelling Gastrointestinal:  Denies nausea, heartburn or change in bowel habits Skin: Denies abnormal skin rashes Lymphatics: Denies new lymphadenopathy or easy bruising Neurological:Denies numbness, tingling or new weaknesses Behavioral/Psych: Mood is stable, no new changes    All other systems were reviewed with the patient and are negative.  PHYSICAL EXAMINATION: ECOG PERFORMANCE STATUS: 1 - Symptomatic but completely ambulatory  Vitals:   01/27/21 0837  BP: (!) 110/58  Pulse: 90  Resp: 18  Temp: 97.6 F (36.4 C)  SpO2: 98%   Filed Weights   01/27/21 0837  Weight: 152 lb 8 oz (69.2 kg)    GENERAL:alert, no distress and comfortable SKIN: skin color, texture, turgor are normal, no rashes or significant lesions EYES: normal, conjunctiva are pink and non-injected, sclera clear OROPHARYNX:no exudate, no erythema and lips, buccal mucosa, and tongue normal  NECK: supple, thyroid normal size, non-tender, without nodularity LYMPH:  no palpable lymphadenopathy in the cervical, axillary or inguinal LUNGS: clear to auscultation and percussion with normal breathing effort HEART: regular rate & rhythm and no murmurs and no lower extremity edema ABDOMEN:abdomen soft, non-tender and normal bowel sounds Musculoskeletal:no cyanosis of digits and no clubbing  PSYCH: alert & oriented x 3 with fluent speech NEURO: no focal motor/sensory deficits    LABORATORY DATA:  I have reviewed the data as listed Lab Results  Component Value Date   WBC 6.6 01/27/2021   HGB 12.6 01/27/2021   HCT 37.5 01/27/2021   MCV 92.8 01/27/2021   PLT 146 (L) 01/27/2021   Lab Results  Component Value Date   NA 141 01/27/2021   K 4.1 01/27/2021   CL 109 01/27/2021   CO2 26 01/27/2021    RADIOGRAPHIC STUDIES: I have personally reviewed the radiological reports and agreed with the findings in the  report.  ASSESSMENT AND PLAN:  Ductal carcinoma in situ (DCIS) of right breast 01/15/2021:Palpable lump in right breast. Diagnostic mammogram and [place] on 01/12/21 showed intermediate right breast calcifications, benign simple cysts in bilateral breast, one of which corresponds with her palpable right breast lump, and no evidence of malignancy bilaterally. Biopsy of the right breast on 01/15/21 showed DCIS with calcification and necrosis, ER+/PR+ (>95%).  Pathology review: I discussed with the patient the difference between DCIS and invasive breast cancer. It is considered a precancerous lesion. DCIS is classified as a 0. It is generally detected through mammograms as calcifications. We discussed the significance of grades and its impact on prognosis. We also discussed the importance of ER and PR receptors and their implications to adjuvant treatment options. Prognosis of DCIS dependence on grade, comedo necrosis. It is anticipated that if not treated, 20-30% of DCIS can develop into invasive breast cancer.  Recommendation: 1. Breast conserving surgery versus bilateral mastectomies 2. Followed by adjuvant radiation therapy 3. Followed by antiestrogen therapy with tamoxifen 5 years  Tamoxifen counseling: We discussed the risks and benefits of tamoxifen. These include but not limited to insomnia, hot flashes, mood changes, vaginal dryness, and weight gain. Although rare, serious side effects including endometrial cancer, risk  of blood clots were also discussed. We strongly believe that the benefits far outweigh the risks. Patient understands these risks and consented to starting treatment. Planned treatment duration is 5 years.  She is undecided on surgical approach.  She may consider doing lumpectomy versus bilateral mastectomies.  Return to clinic 1 week after surgery to discuss the final pathology report.    All questions were answered. The patient knows to call the clinic with any problems,  questions or concerns.   Sabas Sous, MD, MPH 01/27/2021    I, Alda Ponder, am acting as scribe for Serena Croissant, MD.  I have reviewed the above documentation for accuracy and completeness, and I agree with the above.

## 2021-01-27 ENCOUNTER — Other Ambulatory Visit: Payer: Self-pay

## 2021-01-27 ENCOUNTER — Ambulatory Visit (HOSPITAL_BASED_OUTPATIENT_CLINIC_OR_DEPARTMENT_OTHER): Payer: No Typology Code available for payment source | Admitting: Genetic Counselor

## 2021-01-27 ENCOUNTER — Ambulatory Visit: Payer: Self-pay | Admitting: Surgery

## 2021-01-27 ENCOUNTER — Ambulatory Visit
Admission: RE | Admit: 2021-01-27 | Discharge: 2021-01-27 | Disposition: A | Discharge status: Home / Self Care | Payer: Medicaid Other | Source: Ambulatory Visit | Attending: Radiation Oncology | Admitting: Radiation Oncology

## 2021-01-27 ENCOUNTER — Encounter: Payer: Self-pay | Admitting: *Deleted

## 2021-01-27 ENCOUNTER — Encounter: Payer: Self-pay | Admitting: General Practice

## 2021-01-27 ENCOUNTER — Ambulatory Visit: Payer: Medicaid Other | Admitting: Physical Therapy

## 2021-01-27 ENCOUNTER — Inpatient Hospital Stay: Payer: Medicaid Other | Attending: Hematology and Oncology | Admitting: Hematology and Oncology

## 2021-01-27 ENCOUNTER — Encounter: Payer: Self-pay | Admitting: Genetic Counselor

## 2021-01-27 ENCOUNTER — Inpatient Hospital Stay: Payer: Medicaid Other

## 2021-01-27 VITALS — BP 110/58 | HR 90 | Temp 97.6°F | Resp 18 | Ht 68.5 in | Wt 152.5 lb

## 2021-01-27 DIAGNOSIS — Z803 Family history of malignant neoplasm of breast: Secondary | ICD-10-CM

## 2021-01-27 DIAGNOSIS — F17210 Nicotine dependence, cigarettes, uncomplicated: Secondary | ICD-10-CM

## 2021-01-27 DIAGNOSIS — D0511 Intraductal carcinoma in situ of right breast: Secondary | ICD-10-CM

## 2021-01-27 DIAGNOSIS — Z800 Family history of malignant neoplasm of digestive organs: Secondary | ICD-10-CM

## 2021-01-27 HISTORY — DX: Family history of malignant neoplasm of digestive organs: Z80.0

## 2021-01-27 HISTORY — DX: Family history of malignant neoplasm of breast: Z80.3

## 2021-01-27 LAB — CMP (CANCER CENTER ONLY)
ALT: 15 U/L (ref 0–44)
AST: 12 U/L — ABNORMAL LOW (ref 15–41)
Albumin: 3.8 g/dL (ref 3.5–5.0)
Alkaline Phosphatase: 46 U/L (ref 38–126)
Anion gap: 6 (ref 5–15)
BUN: 8 mg/dL (ref 6–20)
CO2: 26 mmol/L (ref 22–32)
Calcium: 8.5 mg/dL — ABNORMAL LOW (ref 8.9–10.3)
Chloride: 109 mmol/L (ref 98–111)
Creatinine: 0.71 mg/dL (ref 0.44–1.00)
GFR, Estimated: >60 mL/min (ref >60)
Glucose, Bld: 118 mg/dL — ABNORMAL HIGH (ref 70–99)
Potassium: 4.1 mmol/L (ref 3.5–5.1)
Sodium: 141 mmol/L (ref 135–145)
Total Bilirubin: 0.3 mg/dL (ref 0.3–1.2)
Total Protein: 5.9 g/dL — ABNORMAL LOW (ref 6.5–8.1)

## 2021-01-27 LAB — CBC WITH DIFFERENTIAL (CANCER CENTER ONLY)
Abs Immature Granulocytes: 0.01 10*3/uL (ref 0.00–0.07)
Basophils Absolute: 0 10*3/uL (ref 0.0–0.1)
Basophils Relative: 0 %
Eosinophils Absolute: 0.2 10*3/uL (ref 0.0–0.5)
Eosinophils Relative: 3 %
HCT: 37.5 % (ref 36.0–46.0)
Hemoglobin: 12.6 g/dL (ref 12.0–15.0)
Immature Granulocytes: 0 %
Lymphocytes Relative: 23 %
Lymphs Abs: 1.5 10*3/uL (ref 0.7–4.0)
MCH: 31.2 pg (ref 26.0–34.0)
MCHC: 33.6 g/dL (ref 30.0–36.0)
MCV: 92.8 fL (ref 80.0–100.0)
Monocytes Absolute: 0.6 10*3/uL (ref 0.1–1.0)
Monocytes Relative: 9 %
Neutro Abs: 4.2 10*3/uL (ref 1.7–7.7)
Neutrophils Relative %: 65 %
Platelet Count: 146 10*3/uL — ABNORMAL LOW (ref 150–400)
RBC: 4.04 MIL/uL (ref 3.87–5.11)
RDW: 11.7 % (ref 11.5–15.5)
WBC Count: 6.6 10*3/uL (ref 4.0–10.5)
nRBC: 0 % (ref 0.0–0.2)

## 2021-01-27 LAB — GENETIC SCREENING ORDER

## 2021-01-27 NOTE — Assessment & Plan Note (Signed)
01/15/2021:Palpable lump in right breast. Diagnostic mammogram and [place] on 01/12/21 showed intermediate right breast calcifications, benign simple cysts in bilateral breast, one of which corresponds with her palpable right breast lump, and no evidence of malignancy bilaterally. Biopsy of the right breast on 01/15/21 showed DCIS with calcification and necrosis, ER+/PR+ (>95%).  Pathology review: I discussed with the patient the difference between DCIS and invasive breast cancer. It is considered a precancerous lesion. DCIS is classified as a 0. It is generally detected through mammograms as calcifications. We discussed the significance of grades and its impact on prognosis. We also discussed the importance of ER and PR receptors and their implications to adjuvant treatment options. Prognosis of DCIS dependence on grade, comedo necrosis. It is anticipated that if not treated, 20-30% of DCIS can develop into invasive breast cancer.  Recommendation: 1. Breast conserving surgery versus bilateral mastectomies 2. Followed by adjuvant radiation therapy 3. Followed by antiestrogen therapy with tamoxifen 5 years  Tamoxifen counseling: We discussed the risks and benefits of tamoxifen. These include but not limited to insomnia, hot flashes, mood changes, vaginal dryness, and weight gain. Although rare, serious side effects including endometrial cancer, risk of blood clots were also discussed. We strongly believe that the benefits far outweigh the risks. Patient understands these risks and consented to starting treatment. Planned treatment duration is 5 years.  She is undecided on surgical approach.  She may consider doing lumpectomy versus bilateral mastectomies.  Return to clinic 1 week after surgery to discuss the final pathology report.

## 2021-01-27 NOTE — Progress Notes (Signed)
Radiation Oncology         [phone]) [phone] ________________________________  Name: Brittney Price        MRN: [id number]  Date of Service: 01/27/2021 DOB: [birth year]  PX:[id number], No Pcp Per (Inactive)  Manus Rudd, MD     REFERRING PHYSICIAN: Manus Rudd, MD   DIAGNOSIS: The encounter diagnosis was Ductal carcinoma in situ (DCIS) of right breast.   HISTORY OF PRESENT ILLNESS: Brittney Price is a 45 y.o. female seen in the multidisciplinary breast clinic for a new diagnosis of right breast cancer. The patient was noted to have a palpable mass in the right breast and this was brought to the attention of the community outreach clinic she was seen in. She underwent diagnostic mammogram which identified a mass at in the 3:00 position. She had diagnostic imaging that showed a cyst as the site of palpable abnormality. Diagnostic mammography however did show a group of calcifications measuring up to 1.2 cm. A stereotactic biopsy on 01/15/21 showed an intermediate grade DCIS with calcifications and necrosis. Her cancer was ER/PR positive and she's seen today to discuss treatment options of her cancer.     PREVIOUS RADIATION THERAPY: No   PAST MEDICAL HISTORY: No past medical history on file.     PAST SURGICAL HISTORY: Past Surgical History:  Procedure Laterality Date   ANKLE SURGERY       FAMILY HISTORY:  Family History  Problem Relation Age of Onset   Breast cancer Maternal Grandmother      SOCIAL HISTORY:  reports that she has been smoking cigarettes. She has been smoking an average of 0.50 packs per day. She has never used smokeless tobacco. She reports current alcohol use. She reports that she does not use drugs. The patient is married to [place] who accompanies her during her visit. She works in [employer].    ALLERGIES: Patient has no known allergies.   MEDICATIONS:  Current Outpatient Medications  Medication Sig Dispense Refill   acetaminophen (TYLENOL) 500 MG tablet  Take 500 mg by mouth every 6 (six) hours as needed.     albuterol (VENTOLIN HFA) 108 (90 Base) MCG/ACT inhaler Inhale 1-2 puffs into the lungs every 6 (six) hours as needed for wheezing or shortness of breath. 18 g 0   ibuprofen (ADVIL) 200 MG tablet Take 200 mg by mouth every 6 (six) hours as needed.     Pseudoeph-Doxylamine-DM-APAP (DAYQUIL/NYQUIL COLD/FLU RELIEF PO) Take by mouth. (Patient not taking: Reported on 01/16/2021)     No current facility-administered medications for this encounter.     REVIEW OF SYSTEMS: On review of systems, the patient reports that she has been dealing with depression and anxiety and since not having insurance didn't pursue medical care, but would like to get in with a PCP for evaluation.     PHYSICAL EXAM:  Wt Readings from Last 3 Encounters:  01/27/21 152 lb 8 oz (69.2 kg)  01/16/21 153 lb (69.4 kg)  01/12/21 153 lb (69.4 kg)   Temp Readings from Last 3 Encounters:  01/27/21 97.6 F (36.4 C) (Temporal)  01/16/21 98 F (36.7 C) (Oral)  12/14/20 97.9 F (36.6 C) (Oral)   BP Readings from Last 3 Encounters:  01/27/21 (!) 110/58  01/16/21 109/88  01/12/21 110/70   Pulse Readings from Last 3 Encounters:  01/27/21 90  01/16/21 65  12/14/20 71    In general this is a well appearing caucasian female in no acute distress. She's alert and oriented x4 and  appropriate throughout the examination. Cardiopulmonary assessment is negative for acute distress and she exhibits normal effort. Bilateral breast exam is deferred.    ECOG = 1  0 - Asymptomatic (Fully active, able to carry on all predisease activities without restriction)  1 - Symptomatic but completely ambulatory (Restricted in physically strenuous activity but ambulatory and able to carry out work of a light or sedentary nature. For example, light housework, office work)  2 - Symptomatic, <50% in bed during the day (Ambulatory and capable of all self care but unable to carry out any work  activities. Up and about more than 50% of waking hours)  3 - Symptomatic, >50% in bed, but not bedbound (Capable of only limited self-care, confined to bed or chair 50% or more of waking hours)  4 - Bedbound (Completely disabled. Cannot carry on any self-care. Totally confined to bed or chair)  5 - Death   Santiago Glad MM, Creech RH, Tormey DC, et al. [phone]). "Toxicity and response criteria of the [facility]". Am. Evlyn Clines. Oncol. 5 (6): 649-55    LABORATORY DATA:  Lab Results  Component Value Date   WBC 6.6 01/27/2021   HGB 12.6 01/27/2021   HCT 37.5 01/27/2021   MCV 92.8 01/27/2021   PLT 146 (L) 01/27/2021   Lab Results  Component Value Date   NA 141 01/27/2021   K 4.1 01/27/2021   CL 109 01/27/2021   CO2 26 01/27/2021   Lab Results  Component Value Date   ALT 15 01/27/2021   AST 12 (L) 01/27/2021   ALKPHOS 46 01/27/2021   BILITOT 0.3 01/27/2021      RADIOGRAPHY: DG Chest 2 View  Result Date: 01/16/2021 CLINICAL DATA:  Left chest pain and shortness of breath EXAM: CHEST - 2 VIEW COMPARISON:  Prior chest x-ray 10/16/2018 FINDINGS: The heart size and mediastinal contours are within normal limits. Both lungs are clear. The visualized skeletal structures are unremarkable. IMPRESSION: Negative chest x-ray. Electronically Signed   By: Malachy Moan M.D.   On: 01/16/2021 11:26   CT Angio Chest PE W and/or Wo Contrast  Result Date: 01/16/2021 CLINICAL DATA:  L chest pain and SOB today. Reports had R breast biopsy yesterday. +d-Dimer EXAM: CT ANGIOGRAPHY CHEST WITH CONTRAST TECHNIQUE: Multidetector CT imaging of the chest was performed using the standard protocol during bolus administration of intravenous contrast. Multiplanar CT image reconstructions and MIPs were obtained to evaluate the vascular anatomy. CONTRAST:  OMNIPAQUE IOHEXOL 350 MG/ML SOLN COMPARISON:  Chest x-ray 01/16/2021 FINDINGS: Cardiovascular: Satisfactory opacification of the pulmonary  arteries to the segmental level. No evidence of pulmonary embolism. The main pulmonary artery is normal in caliber. Normal heart size. No significant pericardial effusion. The thoracic aorta is normal in caliber. No atherosclerotic plaque of the thoracic aorta. No coronary artery calcifications. Mediastinum/Nodes: No enlarged mediastinal, hilar, or axillary lymph nodes. Thyroid gland, trachea, and esophagus demonstrate no significant findings. Lungs/Pleura: 3 mm pulmonary nodule within the right lower lobe (6:93). Bilateral lower lobe subsegmental atelectasis. No pulmonary mass. No focal consolidation. No pleural effusion. No pneumothorax. Upper Abdomen: No acute abnormality. Musculoskeletal: Nonspecific coarse calcification within the right breast (4:66). No suspicious lytic or blastic osseous lesions. No acute displaced fracture. Multilevel degenerative changes of the spine. Review of the MIP images confirms the above findings. IMPRESSION: 1. No pulmonary embolus. 2. No acute intrathoracic abnormality. Electronically Signed   By: Tish Frederickson M.D.   On: 01/16/2021 16:57   US BREAST LTD UNI  LEFT INC AXILLA  Result Date: 01/12/2021 CLINICAL DATA:  45 year old female with a palpable right breast lump. EXAM: DIGITAL DIAGNOSTIC BILATERAL MAMMOGRAM WITH TOMOSYNTHESIS AND CAD; ULTRASOUND LEFT BREAST LIMITED; ULTRASOUND RIGHT BREAST LIMITED TECHNIQUE: Bilateral digital diagnostic mammography and breast tomosynthesis was performed. The images were evaluated with computer-aided detection.; Targeted ultrasound examination of the left breast was performed; Targeted ultrasound examination of the right breast was performed COMPARISON:  None. ACR Breast Density Category d: The breast tissue is extremely dense, which lowers the sensitivity of mammography. FINDINGS: A radiopaque BB was placed at the site of the patient's palpable lump in the medial right breast. There is a large oval, circumscribed equal density mass deep to  the radiopaque BB. Additional masses are identified in the far outer left breast. Additionally, there is a 1.2 cm group of pleomorphic calcifications in the central right breast at mid depth. These have an indeterminate morphology. Additional punctate and layering calcifications throughout the bilateral breasts are consistent with a benign etiology. Targeted ultrasound is performed, showing multiple oval, circumscribed anechoic masses throughout the bilateral breasts. The largest on the right at the 2 o'clock position measures 2.1 x 1.7 x 1.5 cm. This correlates with the patient's palpable lump. The largest on the left measures 4.1 x 3.9 x 1.1 cm. These are consistent with benign simple cysts. IMPRESSION: 1. Indeterminate right breast calcifications. Recommendation is for stereotactic biopsy. 2. Benign simple cysts in the bilateral breasts, 1 of which corresponds with the patient's right breast palpable lump. 3. Otherwise, no mammographic evidence of malignancy in either breast. RECOMMENDATION: Stereotactic biopsy of the right breast. I have discussed the findings and recommendations with the patient. If applicable, a reminder letter will be sent to the patient regarding the next appointment. BI-RADS CATEGORY  4: Suspicious. Electronically Signed   By: Sande Brothers M.D.   On: 01/12/2021 14:28   US BREAST LTD UNI RIGHT INC AXILLA  Result Date: 01/12/2021 CLINICAL DATA:  45 year old female with a palpable right breast lump. EXAM: DIGITAL DIAGNOSTIC BILATERAL MAMMOGRAM WITH TOMOSYNTHESIS AND CAD; ULTRASOUND LEFT BREAST LIMITED; ULTRASOUND RIGHT BREAST LIMITED TECHNIQUE: Bilateral digital diagnostic mammography and breast tomosynthesis was performed. The images were evaluated with computer-aided detection.; Targeted ultrasound examination of the left breast was performed; Targeted ultrasound examination of the right breast was performed COMPARISON:  None. ACR Breast Density Category d: The breast tissue is  extremely dense, which lowers the sensitivity of mammography. FINDINGS: A radiopaque BB was placed at the site of the patient's palpable lump in the medial right breast. There is a large oval, circumscribed equal density mass deep to the radiopaque BB. Additional masses are identified in the far outer left breast. Additionally, there is a 1.2 cm group of pleomorphic calcifications in the central right breast at mid depth. These have an indeterminate morphology. Additional punctate and layering calcifications throughout the bilateral breasts are consistent with a benign etiology. Targeted ultrasound is performed, showing multiple oval, circumscribed anechoic masses throughout the bilateral breasts. The largest on the right at the 2 o'clock position measures 2.1 x 1.7 x 1.5 cm. This correlates with the patient's palpable lump. The largest on the left measures 4.1 x 3.9 x 1.1 cm. These are consistent with benign simple cysts. IMPRESSION: 1. Indeterminate right breast calcifications. Recommendation is for stereotactic biopsy. 2. Benign simple cysts in the bilateral breasts, 1 of which corresponds with the patient's right breast palpable lump. 3. Otherwise, no mammographic evidence of malignancy in either breast. RECOMMENDATION: Stereotactic biopsy of the  right breast. I have discussed the findings and recommendations with the patient. If applicable, a reminder letter will be sent to the patient regarding the next appointment. BI-RADS CATEGORY  4: Suspicious. Electronically Signed   By: Sande Brothers M.D.   On: 01/12/2021 14:28   MS DIGITAL DIAG TOMO BILAT  Result Date: 01/12/2021 CLINICAL DATA:  45 year old female with a palpable right breast lump. EXAM: DIGITAL DIAGNOSTIC BILATERAL MAMMOGRAM WITH TOMOSYNTHESIS AND CAD; ULTRASOUND LEFT BREAST LIMITED; ULTRASOUND RIGHT BREAST LIMITED TECHNIQUE: Bilateral digital diagnostic mammography and breast tomosynthesis was performed. The images were evaluated with  computer-aided detection.; Targeted ultrasound examination of the left breast was performed; Targeted ultrasound examination of the right breast was performed COMPARISON:  None. ACR Breast Density Category d: The breast tissue is extremely dense, which lowers the sensitivity of mammography. FINDINGS: A radiopaque BB was placed at the site of the patient's palpable lump in the medial right breast. There is a large oval, circumscribed equal density mass deep to the radiopaque BB. Additional masses are identified in the far outer left breast. Additionally, there is a 1.2 cm group of pleomorphic calcifications in the central right breast at mid depth. These have an indeterminate morphology. Additional punctate and layering calcifications throughout the bilateral breasts are consistent with a benign etiology. Targeted ultrasound is performed, showing multiple oval, circumscribed anechoic masses throughout the bilateral breasts. The largest on the right at the 2 o'clock position measures 2.1 x 1.7 x 1.5 cm. This correlates with the patient's palpable lump. The largest on the left measures 4.1 x 3.9 x 1.1 cm. These are consistent with benign simple cysts. IMPRESSION: 1. Indeterminate right breast calcifications. Recommendation is for stereotactic biopsy. 2. Benign simple cysts in the bilateral breasts, 1 of which corresponds with the patient's right breast palpable lump. 3. Otherwise, no mammographic evidence of malignancy in either breast. RECOMMENDATION: Stereotactic biopsy of the right breast. I have discussed the findings and recommendations with the patient. If applicable, a reminder letter will be sent to the patient regarding the next appointment. BI-RADS CATEGORY  4: Suspicious. Electronically Signed   By: Sande Brothers M.D.   On: 01/12/2021 14:28   MM CLIP PLACEMENT RIGHT  Result Date: 01/15/2021 CLINICAL DATA:  Patient is post stereotactic core needle biopsy of a 1.2 cm group of indeterminate  microcalcifications over the upper central right breast. EXAM: DIAGNOSTIC RIGHT MAMMOGRAM POST STEREOTACTIC BIOPSY COMPARISON:  Previous exam(s). FINDINGS: Mammographic images were obtained following stereotactic guided biopsy of the targeted microcalcifications over the upper central right breast. The biopsy marking clip is in expected position at the site of biopsy. IMPRESSION: Appropriate positioning of the X shaped biopsy marking clip at the site of biopsy in the upper central right breast. Final Assessment: Post Procedure Mammograms for Marker Placement Electronically Signed   By: Elberta Fortis M.D.   On: 01/15/2021 13:34  MM RT BREAST BX W LOC DEV 1ST LESION IMAGE BX SPEC STEREO GUIDE  Addendum Date: 01/18/2021   ADDENDUM REPORT: 01/18/2021 12:23 ADDENDUM: Pathology revealed INTERMEDIATE GRADE DUCTAL CARCINOMA IN SITU WITH CALCIFICATIONS AND NECROSIS of the Right breast, upper central, (x-clip). This was found to be concordant by Dr. Elberta Fortis. Pathology results were discussed with the patient by telephone. The patient reported doing well after the biopsy with tenderness at the site. Post biopsy instructions and care were reviewed and questions were answered. The patient was encouraged to call [facility] for any additional concerns. My direct phone number was  provided. The patient was [facility], [facility]. Consideration for a bilateral breast MRI for further evaluation of extent of disease given extremely dense breast tissue and age. Pathology results reported by Rene KocherLynne Bailey, RN on 01/18/2021. Electronically Signed   By: Elberta Fortisaniel  Boyle M.D.   On: 01/18/2021 12:23   Result Date: 01/18/2021 CLINICAL DATA:  Patient presents for stereotactic core needle biopsy of a 1.2 cm group of indeterminate microcalcifications over the upper central right breast. EXAM: RIGHT BREAST STEREOTACTIC  CORE NEEDLE BIOPSY COMPARISON:  Previous exams. FINDINGS: The patient and I discussed the procedure of stereotactic-guided biopsy including benefits and alternatives. We discussed the high likelihood of a successful procedure. We discussed the risks of the procedure including infection, bleeding, tissue injury, clip migration, and inadequate sampling. Informed written consent was given. The usual time out protocol was performed immediately prior to the procedure. Using sterile technique and 1% Lidocaine as local anesthetic, under stereotactic guidance, a 9 gauge vacuum assisted device was used to perform core needle biopsy of the targeted microcalcifications over the upper central right breast using a superior to inferior approach. Specimen radiograph was performed showing multiple of the targeted microcalcifications. Specimens with calcifications are identified for pathology. Lesion quadrant: Right upper inner quadrant (12-12:30 position). At the conclusion of the procedure, X shaped tissue marker clip was deployed into the biopsy cavity. Follow-up 2-view mammogram was performed and dictated separately. IMPRESSION: Stereotactic-guided biopsy of indeterminate right breast microcalcifications. No apparent complications. Electronically Signed: By: Elberta Fortisaniel  Boyle M.D. On: 01/15/2021 13:26      IMPRESSION/PLAN: 1. ER/PR positive, intermediate grade DCIS of the right breast. Dr. Mitzi HansenMoody discusses the pathology findings and reviews the nature of right breast disease. The consensus from the breast conference includes breast conservation with lumpectomy, but the patient is interested in bilateral mastectomies. If she decided on lumpectomy, external radiotherapy to the breast would be recommended to reduce risks of local recurrence followed by antiestrogen therapy. We discussed the risks, benefits, short, and long term effects of radiotherapy, as well as the curative intent, as well as a 6 1/2 week course of radiation if  she proceeded with breast conserving surgery. At this time she's not interested in conservation, and with mastectomy doesn't anticipate a role for radiotherapy. We will see her as needed moving forward. 2. Mental health concerns. We will make sure she has referral to behavioral health for evaluation.    In a visit lasting 45 minutes, greater than 50% of the time was spent face to face reviewing her case, as well as in preparation of, discussing, and coordinating the patient's care.  The above documentation reflects my direct findings during this shared patient visit. Please see the separate note by Dr. Mitzi HansenMoody on this date for the remainder of the patient's plan of care.    Osker MasonAlison C. Ameira Alessandrini, [facility]    **Disclaimer: This note was dictated with voice recognition software. Similar sounding words can inadvertently be transcribed and this note may contain transcription errors which may not have been corrected upon publication of note.**

## 2021-01-27 NOTE — H&P (Signed)
History of Present Illness Brittney Price. Oak Dorey MD; 01/27/2021 12:03 PM) The patient is a 45 year old female who presents with breast cancer. 01/27/21 Breast [facility] [place]/ Mitzi Hansen  This is a 45 year old female in good health who presents with a palpable right breast mass.  She underwent work-up which revealed the mass to be a cyst.  However, at 12:30 in the retroareolar region, she had a 1.2 cm area of indeterminate microcalcifications.  Biopsy revealed DCIS intermediate grade ER/PR +.  She has extremely dense breasts and MRI is recommended.  She comes in today with her girlfriend to discuss treatment options.  Prior to my visit, she had verbalized to the other physicians that she wanted bilateral mastectomies without reconstruction.  FH - [employer] - 1/2 PPD, + marijuana, + EtOH  CLINICAL DATA:  45 year old female with a palpable right breast lump.  EXAM: DIGITAL DIAGNOSTIC BILATERAL MAMMOGRAM WITH TOMOSYNTHESIS AND CAD; ULTRASOUND LEFT BREAST LIMITED; ULTRASOUND RIGHT BREAST LIMITED  TECHNIQUE: Bilateral digital diagnostic mammography and breast tomosynthesis was performed. The images were evaluated with computer-aided detection.; Targeted ultrasound examination of the left breast was performed; Targeted ultrasound examination of the right breast was performed  COMPARISON:  None.  ACR Breast Density Category d: The breast tissue is extremely dense, which lowers the sensitivity of mammography.  FINDINGS: A radiopaque BB was placed at the site of the patient's palpable lump in the medial right breast. There is a large oval, circumscribed equal density mass deep to the radiopaque BB. Additional masses are identified in the far outer left breast.  Additionally, there is a 1.2 cm group of pleomorphic calcifications in the central right breast at mid depth. These have an indeterminate morphology. Additional punctate and layering calcifications throughout the bilateral breasts are consistent  with a benign etiology.  Targeted ultrasound is performed, showing multiple oval, circumscribed anechoic masses throughout the bilateral breasts. The largest on the right at the 2 o'clock position measures 2.1 x 1.7 x 1.5 cm. This correlates with the patient's palpable lump. The largest on the left measures 4.1 x 3.9 x 1.1 cm. These are consistent with benign simple cysts.  IMPRESSION: 1. Indeterminate right breast calcifications. Recommendation is for stereotactic biopsy. 2. Benign simple cysts in the bilateral breasts, 1 of which corresponds with the patient's right breast palpable lump. 3. Otherwise, no mammographic evidence of malignancy in either breast.  RECOMMENDATION: Stereotactic biopsy of the right breast.  I have discussed the findings and recommendations with the patient. If applicable, a reminder letter will be sent to the patient regarding the next appointment.  BI-RADS CATEGORY  4: Suspicious.   Electronically Signed  By: Sande Brothers M.D.  On: 01/12/2021 14:28   Problem List/Past Medical Brittney Hazard K. Zahniya [name], MD; 01/27/2021 12:03 PM) Brittney Price CARCINOMA IN SITU OF RIGHT BREAST (D05.11)   Allergies Brittney Hazard K. Brittney Haughton, MD; 01/27/2021 12:03 PM) No Known Allergies  [01/25/2021]:  Medication History Brittney Price. Brittney Rosner, MD; 01/27/2021 12:03 PM) Medications Reconciled  Tylenol Extra Strength  (500MG  Tablet, Oral) Active. Albuterol  (90MCG/ACT Aerosol Soln, Inhalation) Active. Ibuprofen  (200MG  Tablet, Oral) Active.     Physical Exam [name]; 01/27/2021 12:05 PM)  The physical exam findings are as follows: Note: Constitutional: WDWN in NAD, conversant, no obvious deformities; resting comfortably Eyes: Pupils equal, round; sclera anicteric; moist conjunctiva; no lid lag HENT: Oral mucosa moist; good dentition Neck: No masses palpated, trachea midline; no thyromegaly Lungs: CTA bilaterally; normal respiratory effort Breasts: symmetric except for  bruising on the right side; palpable hematoma; no axillary lymphadenopathy; no nipple discharge CV: Regular rate and rhythm; no murmurs; extremities well-perfused with no edema Abd: +bowel sounds, soft, non-tender, no palpable organomegaly; no palpable hernias Musc: Normal gait; no apparent clubbing or cyanosis in extremities Lymphatic: No palpable cervical or axillary lymphadenopathy Skin: Warm, dry; no sign of jaundice Psychiatric - alert and oriented x 4; calm mood and affect    Assessment & Plan Brittney Hazard K. [name]; 01/27/2021 12:08 PM)  DUCTAL CARCINOMA IN SITU OF RIGHT BREAST (D05.11) Impression: RUIQ 12:30 1.2 cm  Current Plans MRI, BOTH BREASTS (60630) Note: We discussed her treatment recommendations.    She tells me that she "has never wanted her breasts" so she is considering bilateral mastectomies without reconstruction.  However, after further discussion about the surgical treatment options, she has decided to proceed with MRI to determine extent of disease due to her density.  She will also pursue genetic testing, which may help make her surgical decision.  If we proceed with bilateral mastectomies, we will plan right SLNB.  No radiation if mastectomies Possible adjuvant anti-estrogens.  We will await the MRI/ Genetics before making a final surgical decision.  Brittney Price. [name], MD, [facility]  General/ Trauma Surgery   01/27/2021 12:08 PM

## 2021-01-27 NOTE — Progress Notes (Signed)
REFERRING PROVIDER: Nicholas Lose, MD [place],  [place] [address]  PRIMARY PROVIDER:  Patient, No Pcp Per (Inactive)  PRIMARY REASON FOR VISIT:  1. Ductal carcinoma in situ (DCIS) of right breast   2. Family history of breast cancer   3. Family history of colon cancer     HISTORY OF PRESENT ILLNESS:   Brittney Price, a 45 y.o. female, was seen for a [place] cancer genetics consultation during the breast multidisciplinary clinic at the request of Dr. Lindi Adie due to a personal and family history of cancer.  [name] presents to clinic today to discuss the possibility of a hereditary predisposition to cancer, to discuss genetic testing, and to further clarify her future cancer risks, as well as potential cancer risks for family members.   In June 2022, at the age of 67, [name] was diagnosed with ductal carcinoma in situ of the right breast. The preliminary treatment plan includes surgery, adjuvant radiation, and anti-estrogens.   CANCER HISTORY:  Oncology History  Ductal carcinoma in situ (DCIS) of right breast  01/15/2021 Initial Diagnosis   Palpable lump in right breast. Diagnostic mammogram and [place] on 01/12/21 showed intermediate right breast calcifications, benign simple cysts in bilateral breast, one of which corresponds with her palpable right breast lump, and no evidence of malignancy bilaterally. Biopsy of the right breast on 01/15/21 showed DCIS with calcification and necrosis, ER+/PR+ (>95%).   01/27/2021 Cancer Staging   Staging form: Breast, AJCC 8th Edition - Clinical stage from 01/27/2021: Stage 0 (cTis (DCIS), cN0, cM0, ER+, PR+) - Signed by Nicholas Lose, MD on 01/27/2021  Stage prefix: Initial diagnosis  Nuclear grade: G2       Past Medical History:  Diagnosis Date   Family history of breast cancer 01/27/2021   Family history of colon cancer 01/27/2021    Past Surgical History:  Procedure Laterality Date   ANKLE SURGERY      Social  History   Socioeconomic History   Marital status: Married    Spouse name: Not on file   Number of children: 3   Years of education: Not on file   Highest education level: 9th grade  Occupational History   Not on file  Tobacco Use   Smoking status: Every Day    Packs/day: 0.50    Pack years: 0.00    Types: Cigarettes   Smokeless tobacco: Never  Vaping Use   Vaping Use: Never used  Substance and Sexual Activity   Alcohol use: Yes    Comment: occasional   Drug use: Yes    Types: Marijuana   Sexual activity: Yes  Other Topics Concern   Not on file  Social History Narrative   Not on file   Social Determinants of Health   Financial Resource Strain: Not on file  Food Insecurity: Not on file  Transportation Needs: No Transportation Needs   Lack of Transportation (Medical): No   Lack of Transportation (Non-Medical): No  Physical Activity: Not on file  Stress: Not on file  Social Connections: Not on file     FAMILY HISTORY:  We obtained a detailed, 4-generation family history.  Significant diagnoses are listed below: Family History  Problem Relation Age of Onset   Throat cancer Father        d. 67   Cancer Maternal Aunt        unknown type; dx 60s   Breast cancer Maternal Grandmother 55   Colon cancer Maternal Grandmother  dx mid-late 51s     Ms. Courter is unaware of previous family history of genetic testing for hereditary cancer risks. There is no reported Ashkenazi Jewish ancestry. There is no known consanguinity.  GENETIC COUNSELING ASSESSMENT: [name] is a 45 y.o. female with a personal and family history of cancer which is somewhat suggestive of a hereditary cancer syndrome and predisposition to cancer given the presence of related cancers at young ages. We, therefore, discussed and recommended the following at today's visit.   DISCUSSION: We discussed that 5 - 10% of cancer is hereditary, with most cases of hereditary breast cancer associated with  mutations in BRCA1/2.  There are other genes that can be associated with hereditary breast cancer syndromes.  Type of cancer risk and level of risk are gene-specific. We discussed that testing is beneficial for several reasons including knowing how to follow individuals after completing their treatment, identifying whether potential treatment options would be beneficial, and understanding if other family members could be at risk for cancer and allowing them to undergo genetic testing.   We reviewed the characteristics, features and inheritance patterns of hereditary cancer syndromes. We also discussed genetic testing, including the appropriate family members to test, the process of testing, insurance coverage and turn-around-time for results. We discussed the implications of a negative, positive and/or variant of uncertain significant result. In order to get genetic test results in a timely manner so that [name] can use these genetic test results for surgical decisions, we recommended [name] pursue genetic testing for the [facility] BRCAPlus STAT Panel. The BRCAplus panel offered by [employer] and includes sequencing and deletion/duplication analysis for the following 8 genes: ATM, BRCA1, BRCA2, CDH1, CHEK2, PALB2, PTEN, and TP53. Once complete, we recommend [name] pursue reflex genetic testing to a more comprehensive gene panel.   Ms. Nile  was offered a common hereditary cancer panel (48 genes) and an expanded pan-cancer panel (77 genes). [name] was informed of the benefits and limitations of each panel, including that expanded pan-cancer panels contain genes that do not have clear management guidelines at this point in time.  We also discussed that as the number of genes included on a panel increases, the chances of variants of uncertain significance increases.  After considering the benefits and limitations of each gene panel, [name]  elected to have an expanded Radio broadcast assistant through  [place].  The CancerNext-Expanded gene panel offered by [facility] and includes sequencing, rearrangement, and RNA analysis for the following 77 genes: AIP, ALK, APC, ATM, AXIN2, BAP1, BARD1, BLM, BMPR1A, BRCA1, BRCA2, BRIP1, CDC73, CDH1, CDK4, CDKN1B, CDKN2A, CHEK2, CTNNA1, DICER1, FANCC, FH, FLCN, GALNT12, KIF1B, LZTR1, MAX, MEN1, MET, MLH1, MSH2, MSH3, MSH6, MUTYH, NBN, NF1, NF2, NTHL1, PALB2, PHOX2B, PMS2, POT1, PRKAR1A, PTCH1, PTEN, RAD51C, RAD51D, RB1, RECQL, RET, SDHA, SDHAF2, SDHB, SDHC, SDHD, SMAD4, SMARCA4, SMARCB1, SMARCE1, STK11, SUFU, TMEM127, TP53, TSC1, TSC2, VHL and XRCC2 (sequencing and deletion/duplication); EGFR, EGLN1, HOXB13, KIT, MITF, PDGFRA, POLD1, and POLE (sequencing only); EPCAM and GREM1 (deletion/duplication only).   Based on Ms. Favata's personal and family history of cancer, she meets medical criteria for genetic testing. Despite that she meets criteria, she may still have an out of pocket cost. Ms. Pretlow completed the patient pay assistance program application for [employer].  We discussed that Cephus Shelling may reach out to her to further discuss this program and offer billing options.   PLAN: After considering the risks, benefits, and limitations, Ms. Eley provided informed consent to pursue  genetic testing and the blood sample was sent to [facility] for analysis of the [place] +RNAinsight Panel. Results should be available within approximately 1-2 weeks' time, at which point they will be disclosed by telephone to Ms. Fanguy, as will any additional recommendations warranted by these results. Ms. Nicklas will receive a summary of her genetic counseling visit and a copy of her results once available. This information will also be available in Epic.   Lastly, we encouraged Ms. Kishi to remain in contact with cancer genetics annually so that we can continuously update the family history and inform her of any changes in cancer genetics and testing that  may be of benefit for this family.   Ms. Sween questions were answered to her satisfaction today. Our contact information was provided should additional questions or concerns arise. Thank you for the referral and allowing [place] to share in the care of your patient.   Keshanna Riso M. Joette Catching, [place], [facility] Genetic Counselor [email or web address] (P) [phone]  The patient was seen for a total of 20 minutes in face-to-face  genetic counseling.  The patient brought her partner, Lenna Sciara, to the appointment.  Drs. Magrinat, Lindi Adie and/or Burr Medico were available to discuss this case as needed.  _______________________________________________________________________ For Office Staff:  Number of people involved in session: 2 Was an Intern/ student involved with case: no

## 2021-01-27 NOTE — Progress Notes (Signed)
[place] Psychosocial Distress Screening Clinical Social Work  Clinical Social Work was referred by distress screening protocol.  The patient scored a 10 on the Psychosocial Distress Thermometer which indicates severe distress. Clinical Social Worker met with patient in exam [facility]  to assess for distress and other psychosocial needs. Patient presents with supportive girlfriend in the room.  Patient presents as nervuos and overwhelmed due to her recent diagnosis of breast cancer and treatment plans, layered on top of preexisting history of what she describes as anxiety, depression, ADHD, difficulty concentrating, racing mind, difficulty sleeping, irritability, mood swings, head banging.  She relates these challenges to trauma from growing up in difficult childhood environment. She has wanted mental health care, but has been unable to access this due to lack of insurance.  She now has [facility] Medicaid and wonders if she can be referred for a comprehensive evaluation and possible medications management and therapy. Given her psychosocial challenges and concerns about insurance, she would also benefit from case management to ensure that she does retain the ability to access mental health care when her BCCP coverage ends.  Also briefly discussed services available to her through the [facility], provided information packet as well.  CSW will research appropriate referrals and communicate these with patient.   ONCBCN DISTRESS SCREENING 01/27/2021  Screening Type Initial Screening  Distress experienced in past week (1-10) 10  Practical problem type Insurance;Food  Emotional problem type Depression;Nervousness/Anxiety  Physical Problem type Sleep/insomnia;Loss of appetitie  Physician notified of physical symptoms Yes  Referral to clinical psychology No  Referral to clinical social work Yes  Referral to dietition No  Referral to financial advocate Yes  Referral to  support programs No  Referral to palliative care No    Clinical Social Worker follow up needed: Yes.    If yes, follow up plan:  Beverely Pace, [place], LCSW Clinical Social Worker Phone:  [phone]

## 2021-01-28 ENCOUNTER — Encounter: Payer: Self-pay | Admitting: *Deleted

## 2021-01-28 ENCOUNTER — Telehealth: Payer: Self-pay | Admitting: *Deleted

## 2021-01-28 NOTE — Telephone Encounter (Signed)
Left vm regarding [facility] from 6.22.22 as well as information regarding change in MRI.  Called and spoke to [facility] -significant other (HIPPA verified) Gave new appt for MRI date and time and location. Encourage Melissa and pt to call with needs. Contact information provided.

## 2021-01-29 ENCOUNTER — Other Ambulatory Visit: Payer: No Typology Code available for payment source

## 2021-01-29 ENCOUNTER — Encounter: Payer: Self-pay | Admitting: General Practice

## 2021-01-29 ENCOUNTER — Telehealth: Payer: Self-pay | Admitting: *Deleted

## 2021-01-29 NOTE — Telephone Encounter (Signed)
I left message on significant other's voicemail about MRI appt. Everything should be retroactive once her medicaid is approved and sometimes this is long process.

## 2021-01-29 NOTE — Progress Notes (Addendum)
[facility] CSW Progress Notes  Patient requested referrals for mental health care during Breast MDC on 01/27/21.  As patient is currently uninsured, she will need to access [place] funded option in [place] - this is [facility] ([address] [place], [place]).  She can walk in Mon - Weds for appts beginning at 8 AM.  Facility recommends arriving at 7:30 as line can be long for services.  She will need a Comprehensive Clinical Assessment from this provider - assessment will determine appropriate services and [facility] will refer as needed.  She can make an appointment by calling [phone], however, provider is booked out until August 2022.  She can get help faster by using the walk in process.  Provider would like the Epic referral placed - CSW messaged K Rogelia Boga RN and Jacqulyn Ducking RN requesting this.  CSW Zavalla messaged and updated - she will be following this patient going forward.  Patient updated about the above information.   Santa Genera, LCSW Clinical Social Worker Phone:  [phone]

## 2021-02-01 ENCOUNTER — Other Ambulatory Visit: Payer: Self-pay | Admitting: *Deleted

## 2021-02-01 DIAGNOSIS — D0511 Intraductal carcinoma in situ of right breast: Secondary | ICD-10-CM

## 2021-02-02 ENCOUNTER — Ambulatory Visit (HOSPITAL_COMMUNITY)
Admission: RE | Admit: 2021-02-02 | Discharge: 2021-02-02 | Disposition: A | Discharge status: Home / Self Care | Payer: Medicaid Other | Source: Ambulatory Visit | Attending: Surgery | Admitting: Surgery

## 2021-02-02 ENCOUNTER — Other Ambulatory Visit: Payer: Self-pay

## 2021-02-02 DIAGNOSIS — D0511 Intraductal carcinoma in situ of right breast: Secondary | ICD-10-CM | POA: Diagnosis present

## 2021-02-02 MED ORDER — GADOBUTROL 1 MMOL/ML IV SOLN
7.0000 mL | Freq: Once | INTRAVENOUS | Status: AC | PRN
  Administered 2021-02-02: 7 mL via INTRAVENOUS

## 2021-02-04 ENCOUNTER — Other Ambulatory Visit: Payer: Self-pay | Admitting: Surgery

## 2021-02-04 ENCOUNTER — Telehealth: Payer: Self-pay | Admitting: Licensed Clinical Social Worker

## 2021-02-04 ENCOUNTER — Encounter: Payer: Self-pay | Admitting: Genetic Counselor

## 2021-02-04 ENCOUNTER — Telehealth: Payer: Self-pay | Admitting: Genetic Counselor

## 2021-02-04 DIAGNOSIS — D0511 Intraductal carcinoma in situ of right breast: Secondary | ICD-10-CM

## 2021-02-04 DIAGNOSIS — Z1379 Encounter for other screening for genetic and chromosomal anomalies: Secondary | ICD-10-CM | POA: Insufficient documentation

## 2021-02-04 NOTE — Telephone Encounter (Signed)
Revealed negative genetic testing for Breast STAT Panel.  Discussed that we do not know why she has breast cancer or why there is cancer in the family. It could be familial, due to a different gene that we are not testing, or maybe our current technology may not be able to pick something up.  It will be important for her to keep in contact with genetics to keep up with whether additional testing may be needed.  Results of pan-cancer panel are pending.

## 2021-02-04 NOTE — Telephone Encounter (Signed)
[facility] Clinical Social Work  CSW contacted patient by phone to follow-up after [facility] and to introduce self as primary CSW. Patient states she is doing well. Her phone is now working and she updated her email (info updated in chart). No other needs at this time.  Pt will contact CSW with any questions or concerns.   Merlyn Albert, LCSW

## 2021-02-11 ENCOUNTER — Telehealth: Payer: Self-pay | Admitting: Genetic Counselor

## 2021-02-11 ENCOUNTER — Ambulatory Visit: Payer: Self-pay | Admitting: Genetic Counselor

## 2021-02-11 DIAGNOSIS — Z1379 Encounter for other screening for genetic and chromosomal anomalies: Secondary | ICD-10-CM

## 2021-02-11 DIAGNOSIS — Z803 Family history of malignant neoplasm of breast: Secondary | ICD-10-CM

## 2021-02-11 DIAGNOSIS — D0511 Intraductal carcinoma in situ of right breast: Secondary | ICD-10-CM

## 2021-02-11 DIAGNOSIS — Z800 Family history of malignant neoplasm of digestive organs: Secondary | ICD-10-CM

## 2021-02-11 NOTE — Progress Notes (Signed)
HPI:  Brittney Price was previously seen in the [place] clinic due to a personal and family history of cancer and concerns regarding a hereditary predisposition to cancer. Please refer to our prior cancer genetics clinic note for more information regarding our discussion, assessment and recommendations, at the time. Brittney Price recent genetic test results were disclosed to her, as were recommendations warranted by these results. These results and recommendations are discussed in more detail below.  CANCER HISTORY:  Oncology History  Ductal carcinoma in situ (DCIS) of right breast  01/15/2021 Initial Diagnosis   Palpable lump in right breast. Diagnostic mammogram and [place] on 01/12/21 showed intermediate right breast calcifications, benign simple cysts in bilateral breast, one of which corresponds with her palpable right breast lump, and no evidence of malignancy bilaterally. Biopsy of the right breast on 01/15/21 showed DCIS with calcification and necrosis, ER+/PR+ (>95%).   01/27/2021 Cancer Staging   Staging form: Breast, AJCC 8th Edition - Clinical stage from 01/27/2021: Stage 0 (cTis (DCIS), cN0, cM0, ER+, PR+) - Signed by Nicholas Lose, MD on 01/27/2021  Stage prefix: Initial diagnosis  Nuclear grade: G2    02/03/2021 Genetic Testing   No pathogenic variants detected in [place] Panel or [facility] CancerNext-Expanded +RNAinsight Panel.  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  The report dates are February 03, 2021 and February 09, 2021, respectively.    The BRCAplus panel offered by [employer] and includes sequencing and deletion/duplication analysis for the following 8 genes: ATM, BRCA1, BRCA2, CDH1, CHEK2, PALB2, PTEN, and TP53.  The CancerNext-Expanded gene panel offered by [facility] and includes sequencing, rearrangement, and RNA analysis for the following 77 genes: AIP, ALK, APC, ATM, AXIN2, BAP1, BARD1, BLM, BMPR1A, BRCA1, BRCA2, BRIP1,  CDC73, CDH1, CDK4, CDKN1B, CDKN2A, CHEK2, CTNNA1, DICER1, FANCC, FH, FLCN, GALNT12, KIF1B, LZTR1, MAX, MEN1, MET, MLH1, MSH2, MSH3, MSH6, MUTYH, NBN, NF1, NF2, NTHL1, PALB2, PHOX2B, PMS2, POT1, PRKAR1A, PTCH1, PTEN, RAD51C, RAD51D, RB1, RECQL, RET, SDHA, SDHAF2, SDHB, SDHC, SDHD, SMAD4, SMARCA4, SMARCB1, SMARCE1, STK11, SUFU, TMEM127, TP53, TSC1, TSC2, VHL and XRCC2 (sequencing and deletion/duplication); EGFR, EGLN1, HOXB13, KIT, MITF, PDGFRA, POLD1, and POLE (sequencing only); EPCAM and GREM1 (deletion/duplication only).      FAMILY HISTORY:  We obtained a detailed, 4-generation family history.  Significant diagnoses are listed below: Family History  Problem Relation Age of Onset   Throat cancer Father        d. 33   Cancer Maternal Aunt        unknown type; dx 67s   Breast cancer Maternal Grandmother 14   Colon cancer Maternal Grandmother        dx mid-late 62s      Brittney Price is unaware of previous family history of genetic testing for hereditary cancer risks. There is no reported Ashkenazi Jewish ancestry. There is no known consanguinity.   GENETIC TEST RESULTS: Genetic testing reported out on February 09, 2021.  The CancerNext-Expanded +RNAinsight Panel found no pathogenic mutations. The CancerNext-Expanded gene panel offered by [facility] and includes sequencing, rearrangement, and RNA analysis for the following 77 genes: AIP, ALK, APC, ATM, AXIN2, BAP1, BARD1, BLM, BMPR1A, BRCA1, BRCA2, BRIP1, CDC73, CDH1, CDK4, CDKN1B, CDKN2A, CHEK2, CTNNA1, DICER1, FANCC, FH, FLCN, GALNT12, KIF1B, LZTR1, MAX, MEN1, MET, MLH1, MSH2, MSH3, MSH6, MUTYH, NBN, NF1, NF2, NTHL1, PALB2, PHOX2B, PMS2, POT1, PRKAR1A, PTCH1, PTEN, RAD51C, RAD51D, RB1, RECQL, RET, SDHA, SDHAF2, SDHB, SDHC, SDHD, SMAD4, SMARCA4, SMARCB1, SMARCE1, STK11, SUFU, TMEM127, TP53,  TSC1, TSC2, VHL and XRCC2 (sequencing and deletion/duplication); EGFR, EGLN1, HOXB13, KIT, MITF, PDGFRA, POLD1, and POLE (sequencing only); EPCAM and GREM1  (deletion/duplication only).   The test report has been scanned into EPIC and is located under the Molecular Pathology section of the Results Review tab.  A portion of the result report is included below for reference.     We discussed with Brittney Price that because current genetic testing is not perfect, it is possible there may be a gene mutation in one of these genes that current testing cannot detect, but that chance is small.  We also discussed, that there could be another gene that has not yet been discovered, or that we have not yet tested, that is responsible for the cancer diagnoses in the family. It is also possible there is a hereditary cause for the cancer in the family that Ms. Emami did not inherit and therefore was not identified in her testing.  Therefore, it is important to remain in touch with cancer genetics in the future so that we can continue to offer Brittney Price the most up to date genetic testing.   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  At this time, it is unknown if this variant is associated with increased cancer risk or if this is a normal finding, but most variants such as this get reclassified to being inconsequential. It should not be used to make medical management decisions. With time, we suspect the lab will determine the significance of this variant, if any. If we do learn more about it, we will try to contact Brittney Price to discuss it further. However, it is important to stay in touch with [place] periodically and keep the address and phone number up to date.  ADDITIONAL GENETIC TESTING: We discussed with Brittney Price that her genetic testing was fairly extensive.  If there are genes identified to increase cancer risk that can be analyzed in the future, we would be happy to discuss and coordinate this testing at that time.    CANCER SCREENING RECOMMENDATIONS: Ms. Gatchell test result is considered  negative (normal).  This means that we have not identified a hereditary cause for her personal history of cancer at this time. Most cancers happen by chance and this negative test suggests that her cancer may fall into this category.    This does not definitively rule out a hereditary predisposition to cancer. It is still possible that there could be genetic mutations that are undetectable by current technology. There could be genetic mutations in genes that have not been tested or identified to increase cancer risk.  Therefore, it is recommended she continue to follow the cancer management and screening guidelines provided by her oncology and primary healthcare provider.   An individual's cancer risk and medical management are not determined by genetic test results alone. Overall cancer risk assessment incorporates additional factors, including personal medical history, family history, and any available genetic information that may result in a personalized plan for cancer prevention and surveillance  RECOMMENDATIONS FOR FAMILY MEMBERS:  Individuals in this family might be at some increased risk of developing cancer, over the general population risk, simply due to the family history of cancer.  We recommended women in this family have a yearly mammogram beginning at age 45, or 59 years younger than the earliest onset of cancer, an annual clinical breast exam, and perform monthly breast self-exams. Women in this family should  also have a gynecological exam as recommended by their primary provider.  First degree relatives of those with colon cancer should receive colonoscopies beginning at age 36, or 10 years prior to the earliest diagnosis of colon cancer in the family, and receive colonoscopies at least every 5 years, or as recommended by their gastroenterologist.    FOLLOW-UP: Lastly, we discussed with Ms. Case that cancer genetics is a rapidly advancing field and it is possible that new genetic tests will  be appropriate for her and/or her family members in the future. We encouraged her to remain in contact with cancer genetics on an annual basis so we can update her personal and family histories and let her know of advances in cancer genetics that may benefit this family.   Our contact number was provided. Ms. Callicott questions were answered to her satisfaction, and she knows she is welcome to call us at anytime with additional questions or concerns.     Nefertari Rebman M. Joette Catching, [place], [facility] Genetic Counselor [email or web address] (P) [phone]

## 2021-02-11 NOTE — Telephone Encounter (Signed)
Revealed negative genetic testing of variant of uncertain significance in GALNT12.  Discussed that we do not know why she has breast cancer or why there is cancer in the family. It could be sporadic/familial, due to a different gene that we are not testing, or maybe our current technology may not be able to pick something up.  It will be important for her to keep in contact with genetics to keep up with whether additional testing may be needed.

## 2021-02-17 ENCOUNTER — Ambulatory Visit
Admission: RE | Admit: 2021-02-17 | Discharge: 2021-02-17 | Disposition: A | Discharge status: Home / Self Care | Payer: No Typology Code available for payment source | Source: Ambulatory Visit | Attending: Surgery | Admitting: Surgery

## 2021-02-17 ENCOUNTER — Other Ambulatory Visit: Payer: Self-pay | Admitting: Surgery

## 2021-02-17 ENCOUNTER — Other Ambulatory Visit: Payer: Self-pay

## 2021-02-17 DIAGNOSIS — D0511 Intraductal carcinoma in situ of right breast: Secondary | ICD-10-CM

## 2021-02-18 ENCOUNTER — Encounter: Payer: Self-pay | Admitting: *Deleted

## 2021-02-19 ENCOUNTER — Ambulatory Visit: Payer: Self-pay | Admitting: Surgery

## 2021-02-19 DIAGNOSIS — D0511 Intraductal carcinoma in situ of right breast: Secondary | ICD-10-CM

## 2021-02-19 NOTE — H&P (View-Only) (Signed)
History of Present Illness Brittney Price. Brittney Virrueta MD; 01/27/2021 12:03 PM) The patient is a 45 year old female who presents with breast cancer. 01/27/21 Breast MDC Gudena/ Mitzi Hansen  This is a 45 year old female in good health who presents with a palpable right breast mass.  She underwent work-up which revealed the mass to be a cyst.  However, at 12:30 in the retroareolar region, she had a 1.2 cm area of indeterminate microcalcifications.  Biopsy revealed DCIS intermediate grade ER/PR +.  She has extremely dense breasts and MRI is recommended.  She comes in today with her girlfriend to discuss treatment options.  Prior to my visit, she had verbalized to the other physicians that she wanted bilateral mastectomies without reconstruction.  FH - [employer] - 1/2 PPD, + marijuana, + EtOH  CLINICAL DATA:  45 year old female with a palpable right breast lump.  EXAM: DIGITAL DIAGNOSTIC BILATERAL MAMMOGRAM WITH TOMOSYNTHESIS AND CAD; ULTRASOUND LEFT BREAST LIMITED; ULTRASOUND RIGHT BREAST LIMITED  TECHNIQUE: Bilateral digital diagnostic mammography and breast tomosynthesis was performed. The images were evaluated with computer-aided detection.; Targeted ultrasound examination of the left breast was performed; Targeted ultrasound examination of the right breast was performed  COMPARISON:  None.  ACR Breast Density Category d: The breast tissue is extremely dense, which lowers the sensitivity of mammography.  FINDINGS: A radiopaque BB was placed at the site of the patient's palpable lump in the medial right breast. There is a large oval, circumscribed equal density mass deep to the radiopaque BB. Additional masses are identified in the far outer left breast.  Additionally, there is a 1.2 cm group of pleomorphic calcifications in the central right breast at mid depth. These have an indeterminate morphology. Additional punctate and layering calcifications throughout the bilateral breasts are consistent  with a benign etiology.  Targeted ultrasound is performed, showing multiple oval, circumscribed anechoic masses throughout the bilateral breasts. The largest on the right at the 2 o'clock position measures 2.1 x 1.7 x 1.5 cm. This correlates with the patient's palpable lump. The largest on the left measures 4.1 x 3.9 x 1.1 cm. These are consistent with benign simple cysts.  IMPRESSION: 1. Indeterminate right breast calcifications. Recommendation is for stereotactic biopsy. 2. Benign simple cysts in the bilateral breasts, 1 of which corresponds with the patient's right breast palpable lump. 3. Otherwise, no mammographic evidence of malignancy in either breast.  RECOMMENDATION: Stereotactic biopsy of the right breast.  I have discussed the findings and recommendations with the patient. If applicable, a reminder letter will be sent to the patient regarding the next appointment.  BI-RADS CATEGORY  4: Suspicious.   Electronically Signed  By: Sande Brothers M.D.  On: 01/12/2021 14:28   Problem List/Past Medical [name] 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 Bodhi Moradi, MD; 01/27/2021 12:03 PM) No Known Allergies   [01/25/2021]:  Medication History Brittney Price. [name], MD; 01/27/2021 12:03 PM) Medications Reconciled  Tylenol Extra Strength  (500MG  Tablet, Oral) Active. Albuterol  (90MCG/ACT Aerosol Soln, Inhalation) Active. Ibuprofen  (200MG  Tablet, Oral) Active.    Physical Exam [name] MD; 01/27/2021 12:05 PM) The physical exam findings are as follows: Note:  Constitutional: WDWN in NAD, conversant, no obvious deformities; resting comfortably Eyes: Pupils equal, round; sclera anicteric; moist conjunctiva; no lid lag HENT: Oral mucosa moist; good dentition Neck: No masses palpated, trachea midline; no thyromegaly Lungs: CTA bilaterally; normal respiratory effort Breasts: symmetric except for  bruising on the right side; palpable hematoma; no axillary lymphadenopathy; no nipple discharge CV: Regular rate and rhythm; no murmurs; extremities well-perfused with no edema Abd: +bowel sounds, soft, non-tender, no palpable organomegaly; no palpable hernias Musc: Normal gait; no apparent clubbing or cyanosis in extremities Lymphatic: No palpable cervical or axillary lymphadenopathy Skin: Warm, dry; no sign of jaundice Psychiatric - alert and oriented x 4; calm mood and affect    Assessment & Plan [name] 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    She tells me that she "has never wanted her breasts" so she is considering bilateral mastectomies without reconstruction.  However, after further discussion about the surgical treatment options, she has decided to proceed with MRI to determine extent of disease due to her density.  She will also pursue genetic testing, which may help make her surgical decision.  If we proceed with bilateral mastectomies, we will plan right SLNB.  No radiation if mastectomies Possible adjuvant anti-estrogens.  We will await the MRI/ Genetics before making a final surgical decision.  Addendum:  MRI showed another possible mass on the right side, but biopsy showed only fibroadenoma.  Genetics was negative.  She has opted for lumpectomy only.    Right radioactive seed localized lumpectomy.  The surgical procedure has been discussed with the patient.  Potential risks, benefits, alternative treatments, and expected outcomes have been explained.  All of the patient's questions at this time have been answered.  The likelihood of reaching the patient's treatment goal is good.  The patient understand the proposed surgical procedure and wishes to proceed.  Brittney Price. [name], MD, [facility]   General Surgery   02/19/2021 4:45 PM

## 2021-02-19 NOTE — H&P (Signed)
History of Present Illness (Brittney Price K. Keiko Myricks MD; 01/27/2021 12:03 PM) The patient is a 45 year old female who presents with breast cancer. 01/27/21 Breast MDC Gudena/ Moody  This is a 45 year old female in good health who presents with a palpable right breast mass.  Brittney Price underwent work-up which revealed the mass to be a cyst.  However, at 12:30 in the retroareolar region, Brittney Price had a 1.2 cm area of indeterminate microcalcifications.  Biopsy revealed DCIS intermediate grade ER/PR +.  Brittney Price has extremely dense breasts and MRI is recommended.  Brittney Price comes in today with her girlfriend to discuss treatment options.  Prior to my visit, Brittney Price had verbalized to the other physicians that Brittney Price wanted bilateral mastectomies without reconstruction.  FH - [employer] - 1/2 PPD, + marijuana, + EtOH  CLINICAL DATA:  45-year-old female with a palpable right breast lump.  EXAM: DIGITAL DIAGNOSTIC BILATERAL MAMMOGRAM WITH TOMOSYNTHESIS AND CAD; ULTRASOUND LEFT BREAST LIMITED; ULTRASOUND RIGHT BREAST LIMITED  TECHNIQUE: Bilateral digital diagnostic mammography and breast tomosynthesis was performed. The images were evaluated with computer-aided detection.; Targeted ultrasound examination of the left breast was performed; Targeted ultrasound examination of the right breast was performed  COMPARISON:  None.  ACR Breast Density Category d: The breast tissue is extremely dense, which lowers the sensitivity of mammography.  FINDINGS: A radiopaque BB was placed at the site of the patient's palpable lump in the medial right breast. There is a large oval, circumscribed equal density mass deep to the radiopaque BB. Additional masses are identified in the far outer left breast.  Additionally, there is a 1.2 cm group of pleomorphic calcifications in the central right breast at mid depth. These have an indeterminate morphology. Additional punctate and layering calcifications throughout the bilateral breasts are consistent  with a benign etiology.  Targeted ultrasound is performed, showing multiple oval, circumscribed anechoic masses throughout the bilateral breasts. The largest on the right at the 2 o'clock position measures 2.1 x 1.7 x 1.5 cm. This correlates with the patient's palpable lump. The largest on the left measures 4.1 x 3.9 x 1.1 cm. These are consistent with benign simple cysts.  IMPRESSION: 1. Indeterminate right breast calcifications. Recommendation is for stereotactic biopsy. 2. Benign simple cysts in the bilateral breasts, 1 of which corresponds with the patient's right breast palpable lump. 3. Otherwise, no mammographic evidence of malignancy in either breast.  RECOMMENDATION: Stereotactic biopsy of the right breast.  I have discussed the findings and recommendations with the patient. If applicable, a reminder letter will be sent to the patient regarding the next appointment.  BI-RADS CATEGORY  4: Suspicious.   Electronically Signed  By: Serena  Chacko M.D.  On: 01/12/2021 14:28   Problem List/Past Medical (Sacora Hawbaker K. 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 Golda Zavalza, MD; 01/27/2021 12:03 PM) No Known Allergies   [01/25/2021]:  Medication History (Ziaire Bieser K. Ginna Schuur, MD; 01/27/2021 12:03 PM) Medications Reconciled  Tylenol Extra Strength  (500MG Tablet, Oral) Active. Albuterol  (90MCG/ACT Aerosol Soln, Inhalation) Active. Ibuprofen  (200MG Tablet, Oral) Active.    Physical Exam (Shaaron Golliday K. Aniko Finnigan MD; 01/27/2021 12:05 PM) The physical exam findings are as follows: Note:  Constitutional: WDWN in NAD, conversant, no obvious deformities; resting comfortably Eyes: Pupils equal, round; sclera anicteric; moist conjunctiva; no lid lag HENT: Oral mucosa moist; good dentition Neck: No masses palpated, trachea midline; no thyromegaly Lungs: CTA bilaterally; normal respiratory effort Breasts: symmetric except for    bruising on the right side; palpable hematoma; no axillary lymphadenopathy; no nipple discharge CV: Regular rate and rhythm; no murmurs; extremities well-perfused with no edema Abd: +bowel sounds, soft, non-tender, no palpable organomegaly; no palpable hernias Musc: Normal gait; no apparent clubbing or cyanosis in extremities Lymphatic: No palpable cervical or axillary lymphadenopathy Skin: Warm, dry; no sign of jaundice Psychiatric - alert and oriented x 4; calm mood and affect    Assessment & Plan [name] 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    Brittney Price tells me that Brittney Price "has never wanted her breasts" so Brittney Price is considering bilateral mastectomies without reconstruction.  However, after further discussion about the surgical treatment options, Brittney Price has decided to proceed with MRI to determine extent of disease due to her density.  Brittney Price will also pursue genetic testing, which may help make her surgical decision.  If we proceed with bilateral mastectomies, we will plan right SLNB.  No radiation if mastectomies Possible adjuvant anti-estrogens.  We will await the MRI/ Genetics before making a final surgical decision.  Addendum:  MRI showed another possible mass on the right side, but biopsy showed only fibroadenoma.  Genetics was negative.  Brittney Price has opted for lumpectomy only.    Right radioactive seed localized lumpectomy.  The surgical procedure has been discussed with the patient.  Potential risks, benefits, alternative treatments, and expected outcomes have been explained.  All of the patient's questions at this time have been answered.  The likelihood of reaching the patient's treatment goal is good.  The patient understand the proposed surgical procedure and wishes to proceed.  Wilmon Arms. [name], MD, [facility]   General Surgery   02/19/2021 4:45 PM

## 2021-02-23 ENCOUNTER — Other Ambulatory Visit: Payer: Self-pay | Admitting: Surgery

## 2021-02-23 ENCOUNTER — Encounter: Payer: Self-pay | Admitting: *Deleted

## 2021-02-23 DIAGNOSIS — D0511 Intraductal carcinoma in situ of right breast: Secondary | ICD-10-CM

## 2021-02-24 ENCOUNTER — Telehealth: Payer: Self-pay | Admitting: Hematology and Oncology

## 2021-02-24 NOTE — Telephone Encounter (Signed)
Scheduled appt per 7/19 sch msg. Pt aware.  

## 2021-02-24 NOTE — Pre-Procedure Instructions (Signed)
Surgical Instructions    Your procedure is scheduled on Tuesday, July 26th.  Report to [facility] Main Entrance "A" at 08:00 A.M., then check in with the Admitting office.  Call this number if you have problems the morning of surgery:  [phone]   If you have any questions prior to your surgery date call [phone]: Open Monday-Friday 8am-4pm    Remember:  Do not eat after midnight the night before your surgery.  You may drink clear liquids until 07:00 AM the morning of your surgery.   Clear liquids allowed are: Water, Non-Citrus Juices (without pulp), Carbonated Beverages, Clear Tea, Black Coffee Only, and Gatorade.  The day of surgery (if you do NOT have diabetes):  Drink ONE (1) Pre-Surgery Clear Ensure by 07:00 AM the morning of surgery.   This drink was given to you during your hospital pre-op appointment visit. Nothing else to drink after completing the Pre-Surgery Clear Ensure.         If you have questions, please contact your surgeon's office.     Take these medicines the morning of surgery with A SIP OF WATER:  IF NEEDED: acetaminophen (TYLENOL)  albuterol (VENTOLIN HFA) inhaler- Please bring with you the day of surgery.     As of today, STOP taking any Aspirin (unless otherwise instructed by your surgeon) Aleve, Naproxen, Ibuprofen, Motrin, Advil, Goody's, BC's, all herbal medications, fish oil, and all vitamins.           Do not wear jewelry or makeup. Do not wear lotions, powders, perfumes, or deodorant. Do not shave 48 hours prior to surgery.   DO Not wear nail polish, gel polish, artificial nails, or any other type of covering on natural nails including finger and toenails. If patients have artificial nails, gel coating, etc. that need to be removed by a nail salon please have this removed prior to surgery or surgery may need to be canceled/delayed if the surgeon/ anesthesia feels like the patient is unable to be adequately monitored. Do not bring valuables  to the hospital.            [facility] is not responsible for any belongings or valuables.  Do NOT Smoke (Tobacco/Vaping) or drink Alcohol 24 hours prior to your procedure. If you use a CPAP at night, you may bring all equipment for your overnight stay.   Contacts, glasses, dentures or bridgework may not be worn into surgery, please bring cases for these belongings   For patients admitted to the hospital, discharge time will be determined by your treatment team.   Patients discharged the day of surgery will not be allowed to drive home, and someone needs to stay with them for 24 hours.  ONLY 1 SUPPORT PERSON MAY BE PRESENT WHILE YOU ARE IN SURGERY. IF YOU ARE TO BE ADMITTED ONCE YOU ARE IN YOUR ROOM YOU WILL BE ALLOWED TWO (2) VISITORS.  Minor children may have two parents present. Special consideration for safety and communication needs will be reviewed on a case by case basis.  Special instructions:    Oral Hygiene is also important to reduce your risk of infection.  Remember - BRUSH YOUR TEETH THE MORNING OF SURGERY WITH YOUR REGULAR TOOTHPASTE   - Preparing For Surgery  Before surgery, you can play an important role. Because skin is not sterile, your skin needs to be as free of germs as possible. You can reduce the number of germs on your skin by washing with CHG (chlorahexidine gluconate) Soap  before surgery.  CHG is an antiseptic cleaner which kills germs and bonds with the skin to continue killing germs even after washing.     Please do not use if you have an allergy to CHG or antibacterial soaps. If your skin becomes reddened/irritated stop using the CHG.  Do not shave (including legs and underarms) for at least 48 hours prior to first CHG shower. It is OK to shave your face.  Please follow these instructions carefully.     Shower the NIGHT BEFORE SURGERY and the MORNING OF SURGERY with CHG Soap.   If you chose to wash your hair, wash your hair first as usual with  your normal shampoo. After you shampoo, rinse your hair and body thoroughly to remove the shampoo.  Then [employer] and genitals (private parts) with your normal soap and rinse thoroughly to remove soap.  After that Use CHG Soap as you would any other liquid soap. You can apply CHG directly to the skin and wash gently with a scrungie or a clean washcloth.   Apply the CHG Soap to your body ONLY FROM THE NECK DOWN.  Do not use on open wounds or open sores. Avoid contact with your eyes, ears, mouth and genitals (private parts). Wash Face and genitals (private parts)  with your normal soap.   Wash thoroughly, paying special attention to the area where your surgery will be performed.  Thoroughly rinse your body with warm water from the neck down.  DO NOT shower/wash with your normal soap after using and rinsing off the CHG Soap.  Pat yourself dry with a CLEAN TOWEL.  Wear CLEAN PAJAMAS to bed the night before surgery  Place CLEAN SHEETS on your bed the night before your surgery  DO NOT SLEEP WITH PETS.   Day of Surgery:  Take a shower with CHG soap. Wear Clean/Comfortable clothing the morning of surgery Do not apply any deodorants/lotions.   Remember to brush your teeth WITH YOUR REGULAR TOOTHPASTE.   Please read over the following fact sheets that you were given.

## 2021-02-25 ENCOUNTER — Encounter (HOSPITAL_COMMUNITY)
Admission: RE | Admit: 2021-02-25 | Discharge: 2021-02-25 | Disposition: A | Discharge status: Home / Self Care | Payer: Medicaid Other | Source: Ambulatory Visit | Attending: Surgery | Admitting: Surgery

## 2021-02-25 ENCOUNTER — Ambulatory Visit
Admission: RE | Admit: 2021-02-25 | Discharge: 2021-02-25 | Disposition: A | Discharge status: Home / Self Care | Payer: No Typology Code available for payment source | Source: Ambulatory Visit | Attending: Surgery | Admitting: Surgery

## 2021-02-25 ENCOUNTER — Encounter (HOSPITAL_COMMUNITY): Payer: Self-pay

## 2021-02-25 ENCOUNTER — Other Ambulatory Visit: Payer: Self-pay

## 2021-02-25 DIAGNOSIS — Z01812 Encounter for preprocedural laboratory examination: Secondary | ICD-10-CM | POA: Insufficient documentation

## 2021-02-25 DIAGNOSIS — D0511 Intraductal carcinoma in situ of right breast: Secondary | ICD-10-CM

## 2021-02-25 LAB — CBC
HCT: 40.6 % (ref 36.0–46.0)
Hemoglobin: 13.4 g/dL (ref 12.0–15.0)
MCH: 31.6 pg (ref 26.0–34.0)
MCHC: 33 g/dL (ref 30.0–36.0)
MCV: 95.8 fL (ref 80.0–100.0)
Platelets: 132 10*3/uL — ABNORMAL LOW (ref 150–400)
RBC: 4.24 MIL/uL (ref 3.87–5.11)
RDW: 11.6 % (ref 11.5–15.5)
WBC: 7.3 10*3/uL (ref 4.0–10.5)
nRBC: 0 % (ref 0.0–0.2)

## 2021-02-25 LAB — COMPREHENSIVE METABOLIC PANEL
ALT: 23 U/L (ref 0–44)
AST: 16 U/L (ref 15–41)
Albumin: 3.7 g/dL (ref 3.5–5.0)
Alkaline Phosphatase: 47 U/L (ref 38–126)
Anion gap: 4 — ABNORMAL LOW (ref 5–15)
BUN: 11 mg/dL (ref 6–20)
CO2: 27 mmol/L (ref 22–32)
Calcium: 8.9 mg/dL (ref 8.9–10.3)
Chloride: 108 mmol/L (ref 98–111)
Creatinine, Ser: 0.7 mg/dL (ref 0.44–1.00)
GFR, Estimated: >60 mL/min (ref >60)
Glucose, Bld: 122 mg/dL — ABNORMAL HIGH (ref 70–99)
Potassium: 3.9 mmol/L (ref 3.5–5.1)
Sodium: 139 mmol/L (ref 135–145)
Total Bilirubin: 0.7 mg/dL (ref 0.3–1.2)
Total Protein: 6.1 g/dL — ABNORMAL LOW (ref 6.5–8.1)

## 2021-02-25 LAB — POCT PREGNANCY, URINE: Preg Test, Ur: NEGATIVE

## 2021-02-25 NOTE — Pre-Procedure Instructions (Signed)
PCP - pt denies Cardiologist - pt denies  PPM/ICD - n/a  Chest x-ray - 01/16/21 EKG - 01/18/21 Stress Test - n/a ECHO - n/a Cardiac Cath - n/a  Sleep Study - n/a  Fasting Blood Sugar - n/a  Blood Thinner Instructions: n/a Aspirin Instructions: n/a  ERAS Protcol - yes PRE-SURGERY Ensure or G2- Ensure  COVID TEST- n/a  Pt's BP during PAT appointment: 100/58, 95/61. Anesthesia made aware, okay for pt to leave (asymptomatic)  Anesthesia review: yes, seed implantation for breast surgery  Patient denies shortness of breath, fever, cough and chest pain at PAT appointment   All instructions explained to the patient, with a verbal understanding of the material. Patient agrees to go over the instructions while at home for a better understanding. Patient also instructed to self quarantine after being tested for COVID-19. The opportunity to ask questions was provided.

## 2021-03-01 ENCOUNTER — Encounter: Payer: Self-pay | Admitting: *Deleted

## 2021-03-01 NOTE — Anesthesia Preprocedure Evaluation (Addendum)
Anesthesia Evaluation  Patient identified by MRN, date of birth, ID band Patient awake    Reviewed: Allergy & Precautions, NPO status , Patient's Chart, lab work & pertinent test results  History of Anesthesia Complications Negative for: history of anesthetic complications  Airway Mallampati: II  TM Distance: >3 FB Neck ROM: Full    Dental  (+) Dental Advisory Given, Missing, Chipped   Pulmonary Current Smoker and Patient abstained from smoking.,    Pulmonary exam normal        Cardiovascular negative cardio ROS Normal cardiovascular exam     Neuro/Psych negative neurological ROS  negative psych ROS   GI/Hepatic negative GI ROS, (+)     substance abuse  marijuana use,   Endo/Other  negative endocrine ROS  Renal/GU negative Renal ROS     Musculoskeletal negative musculoskeletal ROS (+)   Abdominal   Peds  Hematology  Plt 132k    Anesthesia Other Findings   Reproductive/Obstetrics  Breast cancer                             Anesthesia Physical Anesthesia Plan  ASA: 2  Anesthesia Plan: General   Post-op Pain Management:    Induction: Intravenous  PONV Risk Score and Plan: 4 or greater and Treatment may vary due to age or medical condition, Ondansetron, Midazolam, Dexamethasone and Scopolamine patch - Pre-op  Airway Management Planned: LMA  Additional Equipment: None  Intra-op Plan:   Post-operative Plan: Extubation in OR  Informed Consent: I have reviewed the patients History and Physical, chart, labs and discussed the procedure including the risks, benefits and alternatives for the proposed anesthesia with the patient or authorized representative who has indicated his/her understanding and acceptance.     Dental advisory given  Plan Discussed with: CRNA and Anesthesiologist  Anesthesia Plan Comments:        Anesthesia Quick Evaluation

## 2021-03-02 ENCOUNTER — Other Ambulatory Visit: Payer: Self-pay

## 2021-03-02 ENCOUNTER — Ambulatory Visit (HOSPITAL_COMMUNITY): Payer: Medicaid Other | Admitting: Physician Assistant

## 2021-03-02 ENCOUNTER — Ambulatory Visit
Admission: RE | Admit: 2021-03-02 | Discharge: 2021-03-02 | Disposition: A | Discharge status: Home / Self Care | Payer: No Typology Code available for payment source | Source: Ambulatory Visit | Attending: Surgery | Admitting: Surgery

## 2021-03-02 ENCOUNTER — Encounter (HOSPITAL_COMMUNITY): Payer: Self-pay | Admitting: Surgery

## 2021-03-02 ENCOUNTER — Encounter (HOSPITAL_COMMUNITY)
Admission: RE | Disposition: A | Discharge status: Home / Self Care | Payer: Self-pay | Source: Home / Self Care | Attending: Surgery

## 2021-03-02 ENCOUNTER — Ambulatory Visit (HOSPITAL_COMMUNITY)
Admission: RE | Admit: 2021-03-02 | Discharge: 2021-03-02 | Disposition: A | Discharge status: Home / Self Care | Payer: Medicaid Other | Attending: Surgery | Admitting: Surgery

## 2021-03-02 DIAGNOSIS — D0511 Intraductal carcinoma in situ of right breast: Secondary | ICD-10-CM | POA: Insufficient documentation

## 2021-03-02 DIAGNOSIS — F1290 Cannabis use, unspecified, uncomplicated: Secondary | ICD-10-CM | POA: Insufficient documentation

## 2021-03-02 DIAGNOSIS — F17210 Nicotine dependence, cigarettes, uncomplicated: Secondary | ICD-10-CM | POA: Diagnosis not present

## 2021-03-02 HISTORY — PX: BREAST LUMPECTOMY WITH RADIOACTIVE SEED LOCALIZATION: SHX6424

## 2021-03-02 SURGERY — BREAST LUMPECTOMY WITH RADIOACTIVE SEED LOCALIZATION
Anesthesia: General | Site: Breast | Laterality: Right

## 2021-03-02 MED ORDER — LIDOCAINE 2% (20 MG/ML) 5 ML SYRINGE
INTRAMUSCULAR | Status: AC
  Filled 2021-03-02: qty 5

## 2021-03-02 MED ORDER — CHLORHEXIDINE GLUCONATE 0.12 % MT SOLN
15.0000 mL | Freq: Once | OROMUCOSAL | Status: AC
  Administered 2021-03-02: 15 mL via OROMUCOSAL
  Filled 2021-03-02: qty 15

## 2021-03-02 MED ORDER — CHLORHEXIDINE GLUCONATE CLOTH 2 % EX PADS: 6.0000 | MEDICATED_PAD | Freq: Once | CUTANEOUS | Status: DC

## 2021-03-02 MED ORDER — PROPOFOL 10 MG/ML IV BOLUS
INTRAVENOUS | Status: DC | PRN
  Administered 2021-03-02: 100 mg via INTRAVENOUS
  Administered 2021-03-02: 200 mg via INTRAVENOUS

## 2021-03-02 MED ORDER — ONDANSETRON HCL 4 MG/2ML IJ SOLN
INTRAMUSCULAR | Status: AC
  Filled 2021-03-02: qty 2

## 2021-03-02 MED ORDER — LACTATED RINGERS IV SOLN: INTRAVENOUS | Status: DC

## 2021-03-02 MED ORDER — EPHEDRINE 5 MG/ML INJ
INTRAVENOUS | Status: AC
  Filled 2021-03-02: qty 5

## 2021-03-02 MED ORDER — ACETAMINOPHEN 500 MG PO TABS
1000.0000 mg | ORAL_TABLET | ORAL | Status: AC
  Administered 2021-03-02: 1000 mg via ORAL
  Filled 2021-03-02: qty 2

## 2021-03-02 MED ORDER — GLYCOPYRROLATE PF 0.2 MG/ML IJ SOSY
PREFILLED_SYRINGE | INTRAMUSCULAR | Status: DC | PRN
  Administered 2021-03-02: .2 mg via INTRAVENOUS

## 2021-03-02 MED ORDER — LIDOCAINE 2% (20 MG/ML) 5 ML SYRINGE
INTRAMUSCULAR | Status: DC | PRN
  Administered 2021-03-02: 60 mg via INTRAVENOUS

## 2021-03-02 MED ORDER — SUCCINYLCHOLINE CHLORIDE 200 MG/10ML IV SOSY
PREFILLED_SYRINGE | INTRAVENOUS | Status: DC | PRN
  Administered 2021-03-02: 100 mg via INTRAVENOUS

## 2021-03-02 MED ORDER — FENTANYL CITRATE (PF) 250 MCG/5ML IJ SOLN
INTRAMUSCULAR | Status: AC
  Filled 2021-03-02: qty 5

## 2021-03-02 MED ORDER — ORAL CARE MOUTH RINSE: 15.0000 mL | Freq: Once | OROMUCOSAL | Status: AC

## 2021-03-02 MED ORDER — PHENYLEPHRINE HCL-NACL 10-0.9 MG/250ML-% IV SOLN
INTRAVENOUS | Status: DC | PRN
  Administered 2021-03-02: 30 ug/min via INTRAVENOUS

## 2021-03-02 MED ORDER — PHENYLEPHRINE 40 MCG/ML (10ML) SYRINGE FOR IV PUSH (FOR BLOOD PRESSURE SUPPORT)
PREFILLED_SYRINGE | INTRAVENOUS | Status: AC
  Filled 2021-03-02: qty 10

## 2021-03-02 MED ORDER — PHENYLEPHRINE HCL (PRESSORS) 10 MG/ML IV SOLN
INTRAVENOUS | Status: DC | PRN
  Administered 2021-03-02: 80 ug via INTRAVENOUS

## 2021-03-02 MED ORDER — PROPOFOL 10 MG/ML IV BOLUS
INTRAVENOUS | Status: AC
  Filled 2021-03-02: qty 40

## 2021-03-02 MED ORDER — DEXAMETHASONE SODIUM PHOSPHATE 10 MG/ML IJ SOLN
INTRAMUSCULAR | Status: DC | PRN
  Administered 2021-03-02: 5 mg via INTRAVENOUS

## 2021-03-02 MED ORDER — MIDAZOLAM HCL 2 MG/2ML IJ SOLN
INTRAMUSCULAR | Status: DC | PRN
  Administered 2021-03-02: 2 mg via INTRAVENOUS

## 2021-03-02 MED ORDER — ONDANSETRON HCL 4 MG/2ML IJ SOLN
INTRAMUSCULAR | Status: DC | PRN
  Administered 2021-03-02: 4 mg via INTRAVENOUS

## 2021-03-02 MED ORDER — FENTANYL CITRATE (PF) 250 MCG/5ML IJ SOLN
INTRAMUSCULAR | Status: DC | PRN
  Administered 2021-03-02 (×2): 50 ug via INTRAVENOUS

## 2021-03-02 MED ORDER — MIDAZOLAM HCL 2 MG/2ML IJ SOLN
INTRAMUSCULAR | Status: AC
  Filled 2021-03-02: qty 2

## 2021-03-02 MED ORDER — GLYCOPYRROLATE PF 0.2 MG/ML IJ SOSY
PREFILLED_SYRINGE | INTRAMUSCULAR | Status: AC
  Filled 2021-03-02: qty 1

## 2021-03-02 MED ORDER — EPHEDRINE SULFATE 50 MG/ML IJ SOLN
INTRAMUSCULAR | Status: DC | PRN
  Administered 2021-03-02: 10 mg via INTRAVENOUS

## 2021-03-02 MED ORDER — BUPIVACAINE-EPINEPHRINE 0.25% -1:200000 IJ SOLN
INTRAMUSCULAR | Status: DC | PRN
  Administered 2021-03-02: 16 mL

## 2021-03-02 MED ORDER — DEXAMETHASONE SODIUM PHOSPHATE 10 MG/ML IJ SOLN
INTRAMUSCULAR | Status: AC
  Filled 2021-03-02: qty 1

## 2021-03-02 MED ORDER — DEXMEDETOMIDINE (PRECEDEX) IN NS 20 MCG/5ML (4 MCG/ML) IV SYRINGE
PREFILLED_SYRINGE | INTRAVENOUS | Status: DC | PRN
  Administered 2021-03-02 (×2): 4 ug via INTRAVENOUS

## 2021-03-02 MED ORDER — CEFAZOLIN SODIUM-DEXTROSE 2-4 GM/100ML-% IV SOLN
2.0000 g | INTRAVENOUS | Status: AC
  Administered 2021-03-02: 2 g via INTRAVENOUS
  Filled 2021-03-02: qty 100

## 2021-03-02 MED ORDER — SUCCINYLCHOLINE CHLORIDE 200 MG/10ML IV SOSY
PREFILLED_SYRINGE | INTRAVENOUS | Status: AC
  Filled 2021-03-02: qty 10

## 2021-03-02 MED ORDER — BUPIVACAINE-EPINEPHRINE (PF) 0.25% -1:200000 IJ SOLN
INTRAMUSCULAR | Status: AC
  Filled 2021-03-02: qty 30

## 2021-03-02 MED ORDER — 0.9 % SODIUM CHLORIDE (POUR BTL) OPTIME
TOPICAL | Status: DC | PRN
  Administered 2021-03-02: 1000 mL

## 2021-03-02 SURGICAL SUPPLY — 41 items
APPLIER CLIP 9.375 MED OPEN (MISCELLANEOUS) ×2
BAG COUNTER SPONGE SURGICOUNT (BAG) ×2 IMPLANT
BENZOIN TINCTURE PRP APPL 2/3 (GAUZE/BANDAGES/DRESSINGS) ×2 IMPLANT
BINDER BREAST LRG (GAUZE/BANDAGES/DRESSINGS) ×2 IMPLANT
BINDER BREAST XLRG (GAUZE/BANDAGES/DRESSINGS) IMPLANT
CANISTER SUCT 3000ML PPV (MISCELLANEOUS) ×2 IMPLANT
CHLORAPREP W/TINT 26 (MISCELLANEOUS) ×2 IMPLANT
CLIP APPLIE 9.375 MED OPEN (MISCELLANEOUS) ×1 IMPLANT
CLSR STERI-STRIP ANTIMIC 1/2X4 (GAUZE/BANDAGES/DRESSINGS) ×2 IMPLANT
COVER PROBE W GEL 5X96 (DRAPES) ×2 IMPLANT
COVER SURGICAL LIGHT HANDLE (MISCELLANEOUS) ×2 IMPLANT
DEVICE DUBIN SPECIMEN MAMMOGRA (MISCELLANEOUS) ×2 IMPLANT
DRAPE CHEST BREAST 15X10 FENES (DRAPES) IMPLANT
DRAPE LAPAROTOMY 100X72 PEDS (DRAPES) ×2 IMPLANT
DRSG TEGADERM 4X4.75 (GAUZE/BANDAGES/DRESSINGS) ×2 IMPLANT
ELECT CAUTERY BLADE 6.4 (BLADE) ×2 IMPLANT
ELECT REM PT RETURN 9FT ADLT (ELECTROSURGICAL) ×2
ELECTRODE REM PT RTRN 9FT ADLT (ELECTROSURGICAL) ×1 IMPLANT
GAUZE SPONGE 2X2 8PLY STRL LF (GAUZE/BANDAGES/DRESSINGS) ×1 IMPLANT
GAUZE SPONGE 4X4 12PLY STRL (GAUZE/BANDAGES/DRESSINGS) ×2 IMPLANT
GLOVE SURG ENC MOIS LTX SZ7 (GLOVE) ×2 IMPLANT
GLOVE SURG UNDER POLY LF SZ7.5 (GLOVE) ×2 IMPLANT
GOWN STRL REUS W/ TWL LRG LVL3 (GOWN DISPOSABLE) ×2 IMPLANT
GOWN STRL REUS W/TWL LRG LVL3 (GOWN DISPOSABLE) ×4
ILLUMINATOR WAVEGUIDE N/F (MISCELLANEOUS) IMPLANT
KIT BASIN OR (CUSTOM PROCEDURE TRAY) ×2 IMPLANT
KIT MARKER MARGIN INK (KITS) ×2 IMPLANT
LIGHT WAVEGUIDE WIDE FLAT (MISCELLANEOUS) IMPLANT
NEEDLE HYPO 25GX1X1/2 BEV (NEEDLE) ×2 IMPLANT
NS IRRIG 1000ML POUR BTL (IV SOLUTION) ×2 IMPLANT
PACK GENERAL/GYN (CUSTOM PROCEDURE TRAY) ×2 IMPLANT
SPONGE GAUZE 2X2 STER 10/PKG (GAUZE/BANDAGES/DRESSINGS) ×1
SPONGE T-LAP 4X18 ~~LOC~~+RFID (SPONGE) ×4 IMPLANT
STRIP CLOSURE SKIN 1/2X4 (GAUZE/BANDAGES/DRESSINGS) ×2 IMPLANT
SUT MNCRL AB 4-0 PS2 18 (SUTURE) ×2 IMPLANT
SUT SILK 2 0 SH (SUTURE) ×2 IMPLANT
SUT VIC AB 3-0 SH 27 (SUTURE) ×2
SUT VIC AB 3-0 SH 27X BRD (SUTURE) ×1 IMPLANT
SYR CONTROL 10ML LL (SYRINGE) ×2 IMPLANT
TOWEL GREEN STERILE (TOWEL DISPOSABLE) ×2 IMPLANT
TOWEL GREEN STERILE FF (TOWEL DISPOSABLE) ×2 IMPLANT

## 2021-03-02 NOTE — Discharge Instructions (Signed)
[facility] Office Phone Number [phone]  BREAST BIOPSY/ PARTIAL MASTECTOMY: POST OP INSTRUCTIONS  Always review your discharge instruction sheet given to you by the facility where your surgery was performed.  IF YOU HAVE DISABILITY OR FAMILY LEAVE FORMS, YOU MUST BRING THEM TO THE OFFICE FOR PROCESSING.  DO NOT GIVE THEM TO YOUR DOCTOR.  A prescription for pain medication may be given to you upon discharge.  Take your pain medication as prescribed, if needed.  If narcotic pain medicine is not needed, then you may take acetaminophen (Tylenol) or ibuprofen (Advil) as needed. Take your usually prescribed medications unless otherwise directed If you need a refill on your pain medication, please contact your pharmacy.  They will contact our office to request authorization.  Prescriptions will not be filled after 5pm or on week-ends. You should eat very light the first 24 hours after surgery, such as soup, crackers, pudding, etc.  Resume your normal diet the day after surgery. Most patients will experience some swelling and bruising in the breast.  Ice packs and a good support bra will help.  Swelling and bruising can take several days to resolve.  It is common to experience some constipation if taking pain medication after surgery.  Increasing fluid intake and taking a stool softener will usually help or prevent this problem from occurring.  A mild laxative (Milk of Magnesia or Miralax) should be taken according to package directions if there are no bowel movements after 48 hours. Unless discharge instructions indicate otherwise, you may remove your bandages 24-48 hours after surgery, and you may shower at that time.  You may have steri-strips (small skin tapes) in place directly over the incision.  These strips should be left on the skin for 7-10 days.  If your surgeon used skin glue on the incision, you may shower in 24 hours.  The glue will flake off over the next 2-3 weeks.  Any  sutures or staples will be removed at the office during your follow-up visit. ACTIVITIES:  You may resume regular daily activities (gradually increasing) beginning the next day.  Wearing a good support bra or sports bra minimizes pain and swelling.  You may have sexual intercourse when it is comfortable. You may drive when you no longer are taking prescription pain medication, you can comfortably wear a seatbelt, and you can safely maneuver your car and apply brakes. RETURN TO WORK:  ______________________________________________________________________________________ You should see your doctor in the office for a follow-up appointment approximately two weeks after your surgery.  Your doctor's nurse will typically make your follow-up appointment when she calls you with your pathology report.  Expect your pathology report 2-3 business days after your surgery.  You may call to check if you do not hear from us after three days. OTHER INSTRUCTIONS: _______________________________________________________________________________________________ _____________________________________________________________________________________________________________________________________ _____________________________________________________________________________________________________________________________________ _____________________________________________________________________________________________________________________________________  WHEN TO CALL YOUR DOCTOR: Fever over 101.0 Nausea and/or vomiting. Extreme swelling or bruising. Continued bleeding from incision. Increased pain, redness, or drainage from the incision.  The clinic staff is available to answer your questions during regular business hours.  Please don't hesitate to call and ask to speak to one of the nurses for clinical concerns.  If you have a medical emergency, go to the nearest emergency room or call 911.  A surgeon from [facility] is always on call at the hospital.  For further questions, please visit [email or web address]   

## 2021-03-02 NOTE — Op Note (Signed)
Pre-op Diagnosis:  Right breast ductal carcinoma in situ Post-op Diagnosis: same Procedure:  Right radioactive seed localized lumpectomy Surgeon:  Annaliz Aven K. Resident:  Dr. Lucas Mallow I was personally present throughout the entire procedure, as documented in my operative note.  Anesthesia:  GEN - LMA Indications:  This is a 45 year old female in good health who presents with a palpable right breast mass.  She underwent work-up which revealed the mass to be a cyst.  However, at 12:30 in the retroareolar region, she had a 1.2 cm area of indeterminate microcalcifications.  Biopsy revealed DCIS intermediate grade ER/PR +.  After MRI and genetic testing, she has opted for lumpectomy.    Description of procedure: The patient is brought to the operating room placed in supine position on the operating room table. After an adequate level of general anesthesia was obtained, her right breast was prepped with ChloraPrep and draped in sterile fashion. A timeout was taken to ensure the proper patient and proper procedure. We interrogated the breast with the neoprobe. We made a circumareolar incision around the upper side of the nipple after infiltrating with 0.25% Marcaine. Dissection was carried down in the breast tissue with cautery. We used the neoprobe to guide [place] towards the radioactive seed. We excised an area of tissue around the radioactive seed 2 cm. The specimen was removed and was oriented with a paint kit. Specimen mammogram showed the radioactive seed as well as the biopsy clip within the specimen. Microcalcifications were seen anterior to the radioactive seed.  This was sent for pathologic examination. There is no residual radioactivity within the biopsy cavity. We inspected carefully for hemostasis. The wound was thoroughly irrigated. The wound was closed with a deep layer of 3-0 Vicryl and a subcuticular layer of 4-0 Monocryl. Benzoin Steri-Strips were applied. The patient was then extubated  and brought to the recovery room in stable condition. All sponge, instrument, and needle counts are correct.  Imogene Burn. Georgette Dover, MD, [facility]  General/ Trauma Surgery  03/02/2021 9:56 AM

## 2021-03-02 NOTE — Interval H&P Note (Signed)
History and Physical Interval Note:  03/02/2021 8:13 AM  Brittney Price  has presented today for surgery, with the diagnosis of RIGHT BREAST DUCTAL.  The various methods of treatment have been discussed with the patient and family. After consideration of risks, benefits and other options for treatment, the patient has consented to  Procedure(s): RIGHT BREAST LUMPECTOMY WITH RADIOACTIVE SEED LOCALIZATION (Right) as a surgical intervention.  The patient's history has been reviewed, patient examined, no change in status, stable for surgery.  I have reviewed the patient's chart and labs.  Questions were answered to the patient's satisfaction.     Wynona Luna

## 2021-03-02 NOTE — Transfer of Care (Signed)
Immediate Anesthesia Transfer of Care Note  Patient: Brittney Price  Procedure(s) Performed: RIGHT BREAST LUMPECTOMY WITH RADIOACTIVE SEED LOCALIZATION (Right: Breast)  Patient Location: PACU  Anesthesia Type:General  Level of Consciousness: drowsy  Airway & Oxygen Therapy: Patient Spontanous Breathing and Patient connected to face mask oxygen  Post-op Assessment: Report given to RN and Post -op Vital signs reviewed and stable  Post vital signs: Reviewed and stable  Last Vitals:  Vitals Value Taken Time  BP 100/48 03/02/21 1010  Temp    Pulse 69 03/02/21 1011  Resp 14 03/02/21 1011  SpO2 100 % 03/02/21 1011  Vitals shown include unvalidated device data.  Last Pain:  Vitals:   03/02/21 0717  TempSrc:   PainSc: 0-No pain         Complications: No notable events documented.

## 2021-03-02 NOTE — Anesthesia Postprocedure Evaluation (Signed)
Anesthesia Post Note  Patient: Brittney Price  Procedure(s) Performed: RIGHT BREAST LUMPECTOMY WITH RADIOACTIVE SEED LOCALIZATION (Right: Breast)     Patient location during evaluation: PACU Anesthesia Type: General Level of consciousness: awake and alert Pain management: pain level controlled Vital Signs Assessment: post-procedure vital signs reviewed and stable Respiratory status: spontaneous breathing, nonlabored ventilation and respiratory function stable Cardiovascular status: stable and blood pressure returned to baseline Anesthetic complications: no   No notable events documented.  Last Vitals:  Vitals:   03/02/21 1040 03/02/21 1049  BP: 101/62 (!) 103/55  Pulse: 66 67  Resp: 15 17  Temp:  36.7 C  SpO2: 98% 97%    Last Pain:  Vitals:   03/02/21 1010  TempSrc:   PainSc: 0-No pain                 Beryle Lathe

## 2021-03-02 NOTE — Anesthesia Procedure Notes (Signed)
Procedure Name: LMA Insertion Date/Time: 03/02/2021 9:05 AM Performed by: Garfield Cornea, CRNA Pre-anesthesia Checklist: Patient identified, Emergency Drugs available, Suction available and Patient being monitored Patient Re-evaluated:Patient Re-evaluated prior to induction Oxygen Delivery Method: Circle System Utilized Preoxygenation: Pre-oxygenation with 100% oxygen Induction Type: IV induction Ventilation: Mask ventilation without difficulty LMA: LMA inserted LMA Size: 4.0 Number of attempts: 1 Airway Equipment and Method: Bite block Placement Confirmation: positive ETCO2 Tube secured with: Tape Dental Injury: Teeth and Oropharynx as per pre-operative assessment  Comments: Inserted by Gillis Ends, SRNA

## 2021-03-03 ENCOUNTER — Encounter (HOSPITAL_COMMUNITY): Payer: Self-pay | Admitting: Surgery

## 2021-03-04 LAB — SURGICAL PATHOLOGY

## 2021-03-08 ENCOUNTER — Encounter: Payer: Self-pay | Admitting: *Deleted

## 2021-03-11 ENCOUNTER — Inpatient Hospital Stay: Payer: Medicaid Other | Attending: Hematology and Oncology | Admitting: Hematology and Oncology

## 2021-03-11 DIAGNOSIS — R634 Abnormal weight loss: Secondary | ICD-10-CM | POA: Insufficient documentation

## 2021-03-11 DIAGNOSIS — D0511 Intraductal carcinoma in situ of right breast: Secondary | ICD-10-CM | POA: Insufficient documentation

## 2021-03-11 NOTE — Assessment & Plan Note (Deleted)
01/15/2021:Palpable lump in right breast. Diagnostic mammogram and [place] on 01/12/21 showed intermediate right breast calcifications, benign simple cysts in bilateral breast, one of which corresponds with her palpable right breast lump, and no evidence of malignancy bilaterally. Biopsy of the right breast on 01/15/21 showed DCIS with calcification and necrosis, ER+/PR+ (>95%).  Recommendation: 1.  Left lumpectomy 03/02/2021: Intermediate grade DCIS, margins negative, CSL with intraductal papillomas, PASH, ER greater than 95%, PR greater than 95% 2. Followed by adjuvant radiation therapy 3. Followed by antiestrogen therapy with tamoxifen 5 years  Pathology counseling: I discussed the final pathology report of the patient provided  a copy of this report. I discussed the margins as well as lymph node surgeries. We also discussed the final staging along with previously performed ER/PR and HER-2/neu testing.  Treatment plan: Adjuvant radiation followed by tamoxifen Return to clinic after radiation is complete

## 2021-03-13 NOTE — Progress Notes (Signed)
Patient Care Team: Patient, No Pcp Per (Inactive) as PCP - General (General Practice) Brittney Germany, RN as Oncology Nurse Navigator Brittney Kaufmann, RN as Oncology Nurse Navigator Brittney Mesa, MD as Consulting Physician (General Surgery) Brittney Lose, MD as Consulting Physician (Hematology and Oncology) Brittney Rudd, MD as Consulting Physician (Radiation Oncology)  DIAGNOSIS:    ICD-10-CM   1. Ductal carcinoma in situ (DCIS) of right breast  D05.11       SUMMARY OF ONCOLOGIC HISTORY: Oncology History  Ductal carcinoma in situ (DCIS) of right breast  01/15/2021 Initial Diagnosis   Palpable lump in right breast. Diagnostic mammogram and [place] on 01/12/21 showed intermediate right breast calcifications, benign simple cysts in bilateral breast, one of which corresponds with her palpable right breast lump, and no evidence of malignancy bilaterally. Biopsy of the right breast on 01/15/21 showed DCIS with calcification and necrosis, ER+/PR+ (>95%).   01/27/2021 Cancer Staging   Staging form: Breast, AJCC 8th Edition - Clinical stage from 01/27/2021: Stage 0 (cTis (DCIS), cN0, cM0, ER+, PR+) - Signed by Brittney Lose, MD on 01/27/2021  Stage prefix: Initial diagnosis  Nuclear grade: G2    02/03/2021 Genetic Testing   No pathogenic variants detected in [facility] Panel or [facility] CancerNext-Expanded +RNAinsight Panel.  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  The report dates are February 03, 2021 and February 09, 2021, respectively.    The BRCAplus panel offered by [employer] and includes sequencing and deletion/duplication analysis for the following 8 genes: ATM, BRCA1, BRCA2, CDH1, CHEK2, PALB2, PTEN, and TP53.  The CancerNext-Expanded gene panel offered by [facility] and includes sequencing, rearrangement, and RNA analysis for the following 77 genes: AIP, ALK, APC, ATM, AXIN2, BAP1, BARD1, BLM, BMPR1A, BRCA1, BRCA2, BRIP1, CDC73, CDH1, CDK4,  CDKN1B, CDKN2A, CHEK2, CTNNA1, DICER1, FANCC, FH, FLCN, GALNT12, KIF1B, LZTR1, MAX, MEN1, MET, MLH1, MSH2, MSH3, MSH6, MUTYH, NBN, NF1, NF2, NTHL1, PALB2, PHOX2B, PMS2, POT1, PRKAR1A, PTCH1, PTEN, RAD51C, RAD51D, RB1, RECQL, RET, SDHA, SDHAF2, SDHB, SDHC, SDHD, SMAD4, SMARCA4, SMARCB1, SMARCE1, STK11, SUFU, TMEM127, TP53, TSC1, TSC2, VHL and XRCC2 (sequencing and deletion/duplication); EGFR, EGLN1, HOXB13, KIT, MITF, PDGFRA, POLD1, and POLE (sequencing only); EPCAM and GREM1 (deletion/duplication only).    03/02/2021 Surgery   Right lumpectomy: intermediate grade DCIS with margins uninvolved by carcinoma, ER greater than 95%, PR greater than 95%     CHIEF COMPLIANT: Follow-up up of right breast cancer  INTERVAL HISTORY: Brittney Price is a 45 y.o. with above-mentioned history of DCIS of the right breast. She underwent a right lumpectomy with Dr. Donnie Price on 03/02/21 for which the pathology showed intermediate grade DCIS with margins uninvolved by carcinoma, ER/PR+(>95%). She reports to the clinic today for follow-up and to review the pathology report.  She is healing and recovering very well from recent surgery.  Her major concern is weight loss.  ALLERGIES:  has No Known Allergies.  MEDICATIONS:  Current Outpatient Medications  Medication Sig Dispense Refill   acetaminophen (TYLENOL) 500 MG tablet Take 500 mg by mouth every 6 (six) hours as needed for moderate pain.     ibuprofen (ADVIL) 200 MG tablet Take 400-600 mg by mouth every 6 (six) hours as needed for moderate pain.     No current facility-administered medications for this visit.    PHYSICAL EXAMINATION: ECOG PERFORMANCE STATUS: 1 - Symptomatic but completely ambulatory  Vitals:   03/15/21 1045  BP: 121/65  Pulse: (!) 56  Resp: 18  Temp: 98.1 F (36.7 C)  SpO2: 98%   Filed Weights   03/15/21 1045  Weight: 149 lb 9.6 oz (67.9 kg)      LABORATORY DATA:  I have reviewed the data as listed CMP Latest Ref Rng & Units  02/25/2021 01/27/2021 01/16/2021  Glucose 70 - 99 mg/dL 122(H) 118(H) 124(H)  BUN 6 - 20 mg/dL _0 Creatinine 0.44 - 1.00 mg/dL 0.70 0.71 0.62  Sodium 135 - 145 mmol/L 139 141 139  Potassium 3.5 - 5.1 mmol/L 3.9 4.1 3.9  Chloride 98 - 111 mmol/L 108 109 109  CO2 22 - 32 mmol/L _1 Calcium 8.9 - 10.3 mg/dL 8.9 8.5(L) 8.8(L)  Total Protein 6.5 - 8.1 g/dL 6.1(L) 5.9(L) -  Total Bilirubin 0.3 - 1.2 mg/dL 0.7 0.3 -  Alkaline Phos 38 - 126 U/L 47 46 -  AST 15 - 41 U/L 16 12(L) -  ALT 0 - 44 U/L 23 15 -    Lab Results  Component Value Date   WBC 7.3 02/25/2021   HGB 13.4 02/25/2021   HCT 40.6 02/25/2021   MCV 95.8 02/25/2021   PLT 132 (L) 02/25/2021   NEUTROABS 4.2 01/27/2021    ASSESSMENT & PLAN:  Ductal carcinoma in situ (DCIS) of right breast 01/15/2021:Palpable lump in right breast. Diagnostic mammogram and [place] on 01/12/21 showed intermediate right breast calcifications, benign simple cysts in bilateral breast, one of which corresponds with her palpable right breast lump, and no evidence of malignancy bilaterally. Biopsy of the right breast on 01/15/21 showed DCIS with calcification and necrosis, ER+/PR+ (>95%).  Recommendation: 1. Breast conserving surgery: 03/02/21: DCIS IG , Margins Neg 2. Followed by adjuvant radiation therapy 3. Followed by antiestrogen therapy with tamoxifen 5 years Weight loss: Possibly related to gastritis: I instructed her to take omeprazole or Nexium over-the-counter.  Return to clinic after radiation to start antiestrogen therapy.   No orders of the defined types were placed in this encounter.  The patient has a good understanding of the overall plan. she agrees with it. she will call with any problems that may develop before the next visit here.  Total time spent: 20 mins including face to face time and time spent for planning, charting and coordination of care  Brittney Eisenmenger, MD, MPH 03/15/2021  I, Brittney Price, am acting as scribe for  Dr. Nicholas Price.  I have reviewed the above documentation for accuracy and completeness, and I agree with the above.

## 2021-03-14 NOTE — Assessment & Plan Note (Signed)
01/15/2021:Palpable lump in right breast. Diagnostic mammogram and [place] on 01/12/21 showed intermediate right breast calcifications, benign simple cysts in bilateral breast, one of which corresponds with her palpable right breast lump, and no evidence of malignancy bilaterally. Biopsy of the right breast on 01/15/21 showed DCIS with calcification and necrosis, ER+/PR+ (>95%).  Recommendation: 1. Breast conserving surgery: 03/02/21: DCIS IG , Margins Neg 2. Followed by adjuvant radiation therapy 3. Followed by antiestrogen therapy with tamoxifen 5 years

## 2021-03-15 ENCOUNTER — Inpatient Hospital Stay (HOSPITAL_BASED_OUTPATIENT_CLINIC_OR_DEPARTMENT_OTHER): Payer: Medicaid Other | Admitting: Hematology and Oncology

## 2021-03-15 ENCOUNTER — Other Ambulatory Visit: Payer: Self-pay

## 2021-03-15 DIAGNOSIS — D0511 Intraductal carcinoma in situ of right breast: Secondary | ICD-10-CM | POA: Diagnosis not present

## 2021-03-15 DIAGNOSIS — R634 Abnormal weight loss: Secondary | ICD-10-CM | POA: Diagnosis not present

## 2021-03-29 NOTE — Progress Notes (Signed)
Patient denies pain, skin changes, or swelling. Reports mild fatigue and tenderness, w/ good range of motion.  Meaningful use questions complete and patient notified of 2:30pm telephone appointment on 03/31/21 and expressed understanding.

## 2021-03-30 ENCOUNTER — Telehealth: Payer: Self-pay

## 2021-03-31 ENCOUNTER — Ambulatory Visit
Admission: RE | Admit: 2021-03-31 | Discharge: 2021-03-31 | Disposition: A | Discharge status: Home / Self Care | Payer: Medicaid Other | Source: Ambulatory Visit | Attending: Radiation Oncology | Admitting: Radiation Oncology

## 2021-03-31 ENCOUNTER — Ambulatory Visit: Payer: Medicaid Other | Admitting: Radiation Oncology

## 2021-03-31 ENCOUNTER — Other Ambulatory Visit: Payer: Self-pay

## 2021-03-31 DIAGNOSIS — D0511 Intraductal carcinoma in situ of right breast: Secondary | ICD-10-CM

## 2021-03-31 NOTE — Progress Notes (Signed)
Radiation Oncology         [phone] ________________________________  Outpatient Follow Up - Conducted via telephone due to current COVID-19 concerns for limiting patient exposure  I spoke with the patient to conduct this consult visit via telephone to spare the patient unnecessary potential exposure in the healthcare setting during the current COVID-19 pandemic. The patient was notified in advance and was offered a WebEX meeting to allow for face to face communication but unfortunately reported that they did not have the appropriate resources/technology to support such a visit and instead preferred to proceed with a telephone consult.    ________________________________  Name: Brittney Price        MRN: [id number]  Date of Service: 03/31/2021 DOB: January 06, 1976  CC: Brittney Croissant, MD     REFERRING PHYSICIAN: Serena Croissant, MD   DIAGNOSIS: The encounter diagnosis was Ductal carcinoma in situ (DCIS) of right breast.   HISTORY OF PRESENT ILLNESS: Brittney Price is a 45 y.o. female originally seen in the multidisciplinary breast clinic for a new diagnosis of right breast cancer. The patient was noted to have a palpable mass in the right breast and this was brought to the attention of the community outreach clinic she was seen in. She underwent diagnostic mammogram which identified a mass at in the 3:00 position. She had diagnostic imaging that showed a cyst as the site of palpable abnormality. Diagnostic mammography however did show a group of calcifications measuring up to 1.2 cm. A stereotactic biopsy on 01/15/21 showed an intermediate grade DCIS with calcifications and necrosis. Her cancer was ER/PR positive.  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  Her tumor was ER/PR positive.  She is seen to discuss next steps for adjuvant radiotherapy.   PREVIOUS RADIATION THERAPY: No   PAST MEDICAL HISTORY:  Past Medical History:  Diagnosis Date   Family history of breast cancer 01/27/2021   Family history of colon cancer 01/27/2021       PAST SURGICAL HISTORY: Past Surgical History:  Procedure Laterality Date   ANKLE SURGERY     BREAST LUMPECTOMY WITH RADIOACTIVE SEED LOCALIZATION Right 03/02/2021   Procedure: RIGHT BREAST LUMPECTOMY WITH RADIOACTIVE SEED LOCALIZATION;  Surgeon: Manus Rudd, MD;  Location: [facility] OR;  Service: General;  Laterality: Right;     FAMILY HISTORY:  Family History  Problem Relation Age of Onset   Throat cancer Father        d. 46   Cancer Maternal Aunt        unknown type; dx 60s   Breast cancer Maternal Grandmother 55   Colon cancer Maternal Grandmother        dx mid-late 9s     SOCIAL HISTORY:  reports that she has been smoking cigarettes. She has been smoking an average of .5 packs per day. She has never used smokeless tobacco. She reports current alcohol use. She reports current drug use. Frequency: 2.00 times per week. Drug: Marijuana. The patient is married to [place], lives in [place], and works in [employer].    ALLERGIES: Patient has no known allergies.   MEDICATIONS:  Current Outpatient Medications  Medication Sig Dispense Refill   acetaminophen (TYLENOL) 500 MG tablet Take 500 mg by mouth every 6 (six) hours as needed for moderate pain.  ibuprofen (ADVIL) 200 MG tablet Take 400-600 mg by mouth every 6 (six) hours as needed for moderate pain.     No current facility-administered medications for this encounter.     REVIEW OF SYSTEMS: On review of systems, the patient reports that she is doing well overall. She denies any concerns about her breast at this time.     PHYSICAL EXAM:  Unable to assess due to encounter  ECOG = 1  0 - Asymptomatic (Fully active, able to  carry on all predisease activities without restriction)  1 - Symptomatic but completely ambulatory (Restricted in physically strenuous activity but ambulatory and able to carry out work of a light or sedentary nature. For example, light housework, office work)  2 - Symptomatic, <50% in bed during the day (Ambulatory and capable of all self care but unable to carry out any work activities. Up and about more than 50% of waking hours)  3 - Symptomatic, >50% in bed, but not bedbound (Capable of only limited self-care, confined to bed or chair 50% or more of waking hours)  4 - Bedbound (Completely disabled. Cannot carry on any self-care. Totally confined to bed or chair)  5 - Death   Santiago Glad MM, Creech RH, Tormey DC, et al. [phone]). "Toxicity and response criteria of the [facility] Group". Am. Evlyn Clines. Oncol. 5 (6): 649-55    LABORATORY DATA:  Lab Results  Component Value Date   WBC 7.3 02/25/2021   HGB 13.4 02/25/2021   HCT 40.6 02/25/2021   MCV 95.8 02/25/2021   PLT 132 (L) 02/25/2021   Lab Results  Component Value Date   NA 139 02/25/2021   K 3.9 02/25/2021   CL 108 02/25/2021   CO2 27 02/25/2021   Lab Results  Component Value Date   ALT 23 02/25/2021   AST 16 02/25/2021   ALKPHOS 47 02/25/2021   BILITOT 0.7 02/25/2021      RADIOGRAPHY: MM Breast Surgical Specimen  Result Date: 03/02/2021 CLINICAL DATA:  Biopsy-proven DCIS involving the upper RIGHT breast. Radioactive seed localization was performed on 02/25/2021 in anticipation of today's lumpectomy. EXAM: SPECIMEN RADIOGRAPH OF THE RIGHT BREAST COMPARISON:  Previous exam(s). FINDINGS: Status post excision of the RIGHT breast. The radioactive seed and the X shaped biopsy marker clip are present in the specimen, completely intact, and are marked for pathology. This was discussed directly with the operating room nurse at the time of interpretation on 03/02/2021 at 9:50 a.m. IMPRESSION: Specimen radiograph of the  RIGHT breast. Electronically Signed   By: Hulan Saas M.D.   On: 03/02/2021 09:52      IMPRESSION/PLAN: 1. ER/PR positive, intermediate grade DCIS of the right breast. Dr. Mitzi Hansen has reviewed her final pathology findings and today I reviewed the nature of early stage breast disease. We reviewed her final pathology results and the rationale for external radiotherapy to the breast  to reduce risks of local recurrence followed by antiestrogen therapy. We discussed the risks, benefits, short, and long term effects of radiotherapy, as well as the curative intent, and the patient is interested in proceeding. Dr. Mitzi Hansen recommends 6 1/2 weeks of radiotherapy to the right breast. She is interested in rescheduling her simulation and will be contacted by our staff to reschedule this. 2. Contraceptive counseling. The patient is not intimate with female partners and is not at risk of pregnancy. No pregnancy test is needed prior to proceed with simulation or treatment.     Given current concerns for patient  exposure during the COVID-19 pandemic, this encounter was conducted via telephone.  The patient has provided two factor identification and has given verbal consent for this type of encounter and has been advised to only accept a meeting of this type in a secure network environment. The time spent during this encounter was 35 minutes including preparation, discussion, and coordination of the patient's care. The attendants for this meeting include Brittney Price  and Deforest Hoyles.  During the encounter,  Brittney Price were located at [facility] Radiation Oncology Department.  Brittney Price was located at home.        Osker Mason, [facility]    **Disclaimer: This note was dictated with voice recognition software. Similar sounding words can inadvertently be transcribed and this note may contain transcription errors which may not have been corrected upon publication of  note.**

## 2021-04-01 ENCOUNTER — Ambulatory Visit: Payer: No Typology Code available for payment source | Admitting: Radiation Oncology

## 2021-04-01 ENCOUNTER — Ambulatory Visit: Payer: No Typology Code available for payment source

## 2021-04-05 ENCOUNTER — Encounter: Payer: Self-pay | Admitting: *Deleted

## 2021-04-06 ENCOUNTER — Ambulatory Visit: Payer: No Typology Code available for payment source | Admitting: Radiation Oncology

## 2021-04-06 ENCOUNTER — Ambulatory Visit
Admission: RE | Admit: 2021-04-06 | Discharge: 2021-04-06 | Disposition: A | Discharge status: Home / Self Care | Payer: Medicaid Other | Source: Ambulatory Visit | Attending: Radiation Oncology | Admitting: Radiation Oncology

## 2021-04-06 ENCOUNTER — Other Ambulatory Visit: Payer: Self-pay

## 2021-04-06 ENCOUNTER — Ambulatory Visit: Payer: No Typology Code available for payment source

## 2021-04-06 DIAGNOSIS — D0511 Intraductal carcinoma in situ of right breast: Secondary | ICD-10-CM | POA: Diagnosis present

## 2021-04-07 ENCOUNTER — Ambulatory Visit: Payer: No Typology Code available for payment source

## 2021-04-07 ENCOUNTER — Ambulatory Visit: Payer: No Typology Code available for payment source | Admitting: Radiation Oncology

## 2021-04-07 NOTE — Telephone Encounter (Signed)
N/A deletion error 

## 2021-04-09 ENCOUNTER — Telehealth: Payer: Self-pay | Admitting: Hematology and Oncology

## 2021-04-09 DIAGNOSIS — R300 Dysuria: Secondary | ICD-10-CM | POA: Insufficient documentation

## 2021-04-09 DIAGNOSIS — D0511 Intraductal carcinoma in situ of right breast: Secondary | ICD-10-CM | POA: Insufficient documentation

## 2021-04-09 NOTE — Addendum Note (Signed)
Encounter addended by: Dorothy Puffer, MD on: 04/09/2021 11:59 AM  Actions taken: Edit attestation on clinical note

## 2021-04-09 NOTE — Telephone Encounter (Signed)
Scheduled appt per 8/29 sch msg. Pt aware.  

## 2021-04-14 ENCOUNTER — Other Ambulatory Visit: Payer: Self-pay

## 2021-04-14 ENCOUNTER — Ambulatory Visit
Admission: RE | Admit: 2021-04-14 | Discharge: 2021-04-14 | Disposition: A | Discharge status: Home / Self Care | Payer: Medicaid Other | Source: Ambulatory Visit | Attending: Radiation Oncology | Admitting: Radiation Oncology

## 2021-04-14 DIAGNOSIS — D0511 Intraductal carcinoma in situ of right breast: Secondary | ICD-10-CM | POA: Diagnosis not present

## 2021-04-15 ENCOUNTER — Ambulatory Visit
Admission: RE | Admit: 2021-04-15 | Discharge: 2021-04-15 | Disposition: A | Discharge status: Home / Self Care | Payer: Medicaid Other | Source: Ambulatory Visit | Attending: Radiation Oncology | Admitting: Radiation Oncology

## 2021-04-15 DIAGNOSIS — D0511 Intraductal carcinoma in situ of right breast: Secondary | ICD-10-CM | POA: Diagnosis not present

## 2021-04-16 ENCOUNTER — Other Ambulatory Visit: Payer: Self-pay

## 2021-04-16 ENCOUNTER — Ambulatory Visit
Admission: RE | Admit: 2021-04-16 | Discharge: 2021-04-16 | Disposition: A | Discharge status: Home / Self Care | Payer: Medicaid Other | Source: Ambulatory Visit | Attending: Radiation Oncology | Admitting: Radiation Oncology

## 2021-04-16 DIAGNOSIS — D0511 Intraductal carcinoma in situ of right breast: Secondary | ICD-10-CM | POA: Diagnosis not present

## 2021-04-16 MED ORDER — ALRA NON-METALLIC DEODORANT (RAD-ONC)
1.0000 "application " | Freq: Once | TOPICAL | Status: AC
  Administered 2021-04-16: 1 via TOPICAL

## 2021-04-16 MED ORDER — RADIAPLEXRX EX GEL
Freq: Once | CUTANEOUS | Status: AC
Start: 2021-04-16 — End: 2021-04-16

## 2021-04-16 NOTE — Progress Notes (Signed)
Pt here for patient teaching.  Pt given Radiation and You booklet, skin care instructions, Alra deodorant, and Radiaplex gel.  Reviewed areas of pertinence such as diarrhea, fatigue, hair loss, skin changes, breast tenderness, and breast swelling . Pt able to give teach back of to pat skin and use unscented/gentle soap,apply Radiaplex bid, avoid applying anything to skin within 4 hours of treatment, avoid wearing an under wire bra, and to use an electric razor if they must shave. Pt verbalizes understanding of information given and will contact nursing with any questions or concerns.     Http://rtanswers.org/treatmentinformation/whattoexpect/index  Coralyn Helling. Vickii Chafe, BSN

## 2021-04-19 ENCOUNTER — Other Ambulatory Visit: Payer: Self-pay

## 2021-04-19 ENCOUNTER — Ambulatory Visit
Admission: RE | Admit: 2021-04-19 | Discharge: 2021-04-19 | Disposition: A | Discharge status: Home / Self Care | Payer: Medicaid Other | Source: Ambulatory Visit | Attending: Radiation Oncology | Admitting: Radiation Oncology

## 2021-04-19 DIAGNOSIS — D0511 Intraductal carcinoma in situ of right breast: Secondary | ICD-10-CM | POA: Diagnosis not present

## 2021-04-20 ENCOUNTER — Ambulatory Visit
Admission: RE | Admit: 2021-04-20 | Discharge: 2021-04-20 | Disposition: A | Discharge status: Home / Self Care | Payer: Medicaid Other | Source: Ambulatory Visit | Attending: Radiation Oncology | Admitting: Radiation Oncology

## 2021-04-20 DIAGNOSIS — D0511 Intraductal carcinoma in situ of right breast: Secondary | ICD-10-CM | POA: Diagnosis not present

## 2021-04-21 ENCOUNTER — Other Ambulatory Visit: Payer: Self-pay

## 2021-04-21 ENCOUNTER — Ambulatory Visit
Admission: RE | Admit: 2021-04-21 | Discharge: 2021-04-21 | Disposition: A | Discharge status: Home / Self Care | Payer: Medicaid Other | Source: Ambulatory Visit | Attending: Radiation Oncology | Admitting: Radiation Oncology

## 2021-04-21 DIAGNOSIS — D0511 Intraductal carcinoma in situ of right breast: Secondary | ICD-10-CM | POA: Diagnosis not present

## 2021-04-22 ENCOUNTER — Ambulatory Visit
Admission: RE | Admit: 2021-04-22 | Discharge: 2021-04-22 | Disposition: A | Discharge status: Home / Self Care | Payer: Medicaid Other | Source: Ambulatory Visit | Attending: Radiation Oncology | Admitting: Radiation Oncology

## 2021-04-22 DIAGNOSIS — D0511 Intraductal carcinoma in situ of right breast: Secondary | ICD-10-CM | POA: Diagnosis not present

## 2021-04-23 ENCOUNTER — Ambulatory Visit
Admission: RE | Admit: 2021-04-23 | Discharge: 2021-04-23 | Disposition: A | Discharge status: Home / Self Care | Payer: Medicaid Other | Source: Ambulatory Visit | Attending: Radiation Oncology | Admitting: Radiation Oncology

## 2021-04-23 ENCOUNTER — Other Ambulatory Visit: Payer: Self-pay

## 2021-04-23 ENCOUNTER — Telehealth: Payer: Self-pay | Admitting: *Deleted

## 2021-04-23 ENCOUNTER — Other Ambulatory Visit: Payer: Self-pay | Admitting: Radiation Oncology

## 2021-04-23 ENCOUNTER — Ambulatory Visit: Payer: Medicaid Other

## 2021-04-23 DIAGNOSIS — D0511 Intraductal carcinoma in situ of right breast: Secondary | ICD-10-CM | POA: Diagnosis not present

## 2021-04-23 DIAGNOSIS — R300 Dysuria: Secondary | ICD-10-CM

## 2021-04-23 LAB — URINALYSIS, COMPLETE (UACMP) WITH MICROSCOPIC
Bilirubin Urine: NEGATIVE
Glucose, UA: NEGATIVE mg/dL
Ketones, ur: NEGATIVE mg/dL
Nitrite: POSITIVE — AB
Protein, ur: 30 mg/dL — AB
Specific Gravity, Urine: 1.02 (ref 1.005–1.030)
WBC, UA: >50 WBC/hpf — ABNORMAL HIGH (ref 0–5)
pH: 5 (ref 5.0–8.0)

## 2021-04-23 MED ORDER — SULFAMETHOXAZOLE-TRIMETHOPRIM 800-160 MG PO TABS: 1.0000 | ORAL_TABLET | Freq: Two times a day (BID) | ORAL | 0 refills | Status: DC

## 2021-04-23 NOTE — Telephone Encounter (Signed)
Spoke with the patient to let her know that we received the results from her urinalysis.  She was informed that we have sent in a prescription to her pharmacy.  She verbalized understanding.  Coralyn Helling. Vickii Chafe, BSN

## 2021-04-25 LAB — URINE CULTURE: Culture: >=100000 — AB

## 2021-04-26 ENCOUNTER — Ambulatory Visit: Payer: Medicaid Other

## 2021-04-26 ENCOUNTER — Ambulatory Visit
Admission: RE | Admit: 2021-04-26 | Discharge: 2021-04-26 | Disposition: A | Discharge status: Home / Self Care | Payer: Medicaid Other | Source: Ambulatory Visit | Attending: Radiation Oncology | Admitting: Radiation Oncology

## 2021-04-26 ENCOUNTER — Other Ambulatory Visit: Payer: Self-pay

## 2021-04-26 DIAGNOSIS — D0511 Intraductal carcinoma in situ of right breast: Secondary | ICD-10-CM | POA: Diagnosis not present

## 2021-04-27 ENCOUNTER — Ambulatory Visit
Admission: RE | Admit: 2021-04-27 | Discharge: 2021-04-27 | Disposition: A | Discharge status: Home / Self Care | Payer: Medicaid Other | Source: Ambulatory Visit | Attending: Radiation Oncology | Admitting: Radiation Oncology

## 2021-04-27 ENCOUNTER — Encounter: Payer: Self-pay | Admitting: Radiation Oncology

## 2021-04-27 DIAGNOSIS — D0511 Intraductal carcinoma in situ of right breast: Secondary | ICD-10-CM | POA: Diagnosis not present

## 2021-04-27 NOTE — Progress Notes (Signed)
Pt called to let us know she was still having urinary symptoms and had not started her antibiotic. We decided to extend her prescription for Bactrim DS to #10, one po BID for 5 days instead of 3 as it had been prescribed. [facility] notified. Pt encouraged to begin therapy.

## 2021-04-28 ENCOUNTER — Other Ambulatory Visit: Payer: Self-pay

## 2021-04-28 ENCOUNTER — Ambulatory Visit
Admission: RE | Admit: 2021-04-28 | Discharge: 2021-04-28 | Disposition: A | Discharge status: Home / Self Care | Payer: Medicaid Other | Source: Ambulatory Visit | Attending: Radiation Oncology | Admitting: Radiation Oncology

## 2021-04-28 DIAGNOSIS — D0511 Intraductal carcinoma in situ of right breast: Secondary | ICD-10-CM | POA: Diagnosis not present

## 2021-04-29 ENCOUNTER — Ambulatory Visit
Admission: RE | Admit: 2021-04-29 | Discharge: 2021-04-29 | Disposition: A | Discharge status: Home / Self Care | Payer: Medicaid Other | Source: Ambulatory Visit | Attending: Radiation Oncology | Admitting: Radiation Oncology

## 2021-04-29 DIAGNOSIS — D0511 Intraductal carcinoma in situ of right breast: Secondary | ICD-10-CM | POA: Diagnosis not present

## 2021-04-30 ENCOUNTER — Other Ambulatory Visit: Payer: Self-pay

## 2021-04-30 ENCOUNTER — Ambulatory Visit
Admission: RE | Admit: 2021-04-30 | Discharge: 2021-04-30 | Disposition: A | Discharge status: Home / Self Care | Payer: Medicaid Other | Source: Ambulatory Visit | Attending: Radiation Oncology | Admitting: Radiation Oncology

## 2021-04-30 DIAGNOSIS — D0511 Intraductal carcinoma in situ of right breast: Secondary | ICD-10-CM | POA: Diagnosis not present

## 2021-05-03 ENCOUNTER — Ambulatory Visit
Admission: RE | Admit: 2021-05-03 | Discharge: 2021-05-03 | Disposition: A | Discharge status: Home / Self Care | Payer: Medicaid Other | Source: Ambulatory Visit | Attending: Radiation Oncology | Admitting: Radiation Oncology

## 2021-05-03 DIAGNOSIS — D0511 Intraductal carcinoma in situ of right breast: Secondary | ICD-10-CM | POA: Diagnosis not present

## 2021-05-04 ENCOUNTER — Ambulatory Visit: Payer: Medicaid Other

## 2021-05-05 ENCOUNTER — Other Ambulatory Visit: Payer: Self-pay

## 2021-05-05 ENCOUNTER — Ambulatory Visit
Admission: RE | Admit: 2021-05-05 | Discharge: 2021-05-05 | Disposition: A | Discharge status: Home / Self Care | Payer: Medicaid Other | Source: Ambulatory Visit | Attending: Radiation Oncology | Admitting: Radiation Oncology

## 2021-05-05 DIAGNOSIS — D0511 Intraductal carcinoma in situ of right breast: Secondary | ICD-10-CM | POA: Diagnosis not present

## 2021-05-06 ENCOUNTER — Ambulatory Visit: Payer: Medicaid Other

## 2021-05-07 ENCOUNTER — Other Ambulatory Visit: Payer: Self-pay

## 2021-05-07 ENCOUNTER — Ambulatory Visit
Admission: RE | Admit: 2021-05-07 | Discharge: 2021-05-07 | Disposition: A | Discharge status: Home / Self Care | Payer: Medicaid Other | Source: Ambulatory Visit | Attending: Radiation Oncology | Admitting: Radiation Oncology

## 2021-05-07 DIAGNOSIS — D0511 Intraductal carcinoma in situ of right breast: Secondary | ICD-10-CM | POA: Diagnosis not present

## 2021-05-10 ENCOUNTER — Other Ambulatory Visit: Payer: Self-pay

## 2021-05-10 ENCOUNTER — Ambulatory Visit
Admission: RE | Admit: 2021-05-10 | Discharge: 2021-05-10 | Disposition: A | Discharge status: Home / Self Care | Payer: Medicaid Other | Source: Ambulatory Visit | Attending: Radiation Oncology | Admitting: Radiation Oncology

## 2021-05-10 DIAGNOSIS — Z510 Encounter for antineoplastic radiation therapy: Secondary | ICD-10-CM | POA: Diagnosis not present

## 2021-05-10 DIAGNOSIS — D0511 Intraductal carcinoma in situ of right breast: Secondary | ICD-10-CM | POA: Diagnosis present

## 2021-05-11 ENCOUNTER — Ambulatory Visit
Admission: RE | Admit: 2021-05-11 | Discharge: 2021-05-11 | Disposition: A | Discharge status: Home / Self Care | Payer: Medicaid Other | Source: Ambulatory Visit | Attending: Radiation Oncology | Admitting: Radiation Oncology

## 2021-05-11 DIAGNOSIS — Z510 Encounter for antineoplastic radiation therapy: Secondary | ICD-10-CM | POA: Diagnosis not present

## 2021-05-12 ENCOUNTER — Ambulatory Visit
Admission: RE | Admit: 2021-05-12 | Discharge: 2021-05-12 | Disposition: A | Discharge status: Home / Self Care | Payer: Medicaid Other | Source: Ambulatory Visit | Attending: Radiation Oncology | Admitting: Radiation Oncology

## 2021-05-12 ENCOUNTER — Other Ambulatory Visit: Payer: Self-pay

## 2021-05-12 DIAGNOSIS — Z510 Encounter for antineoplastic radiation therapy: Secondary | ICD-10-CM | POA: Diagnosis not present

## 2021-05-13 ENCOUNTER — Ambulatory Visit
Admission: RE | Admit: 2021-05-13 | Discharge: 2021-05-13 | Disposition: A | Discharge status: Home / Self Care | Payer: Medicaid Other | Source: Ambulatory Visit | Attending: Radiation Oncology | Admitting: Radiation Oncology

## 2021-05-13 DIAGNOSIS — Z510 Encounter for antineoplastic radiation therapy: Secondary | ICD-10-CM | POA: Diagnosis not present

## 2021-05-14 ENCOUNTER — Ambulatory Visit
Admission: RE | Admit: 2021-05-14 | Discharge: 2021-05-14 | Disposition: A | Discharge status: Home / Self Care | Payer: Medicaid Other | Source: Ambulatory Visit | Attending: Radiation Oncology | Admitting: Radiation Oncology

## 2021-05-14 ENCOUNTER — Other Ambulatory Visit: Payer: Self-pay

## 2021-05-14 DIAGNOSIS — Z510 Encounter for antineoplastic radiation therapy: Secondary | ICD-10-CM | POA: Diagnosis not present

## 2021-05-14 DIAGNOSIS — D0511 Intraductal carcinoma in situ of right breast: Secondary | ICD-10-CM

## 2021-05-14 MED ORDER — RADIAPLEXRX EX GEL: Freq: Once | CUTANEOUS | Status: AC

## 2021-05-17 ENCOUNTER — Ambulatory Visit
Admission: RE | Admit: 2021-05-17 | Discharge: 2021-05-17 | Disposition: A | Discharge status: Home / Self Care | Payer: Medicaid Other | Source: Ambulatory Visit | Attending: Radiation Oncology | Admitting: Radiation Oncology

## 2021-05-17 ENCOUNTER — Other Ambulatory Visit: Payer: Self-pay

## 2021-05-17 DIAGNOSIS — Z510 Encounter for antineoplastic radiation therapy: Secondary | ICD-10-CM | POA: Diagnosis not present

## 2021-05-18 ENCOUNTER — Ambulatory Visit: Payer: Medicaid Other

## 2021-05-19 ENCOUNTER — Other Ambulatory Visit: Payer: Self-pay

## 2021-05-19 ENCOUNTER — Ambulatory Visit
Admission: RE | Admit: 2021-05-19 | Discharge: 2021-05-19 | Disposition: A | Discharge status: Home / Self Care | Payer: Medicaid Other | Source: Ambulatory Visit | Attending: Radiation Oncology | Admitting: Radiation Oncology

## 2021-05-19 DIAGNOSIS — Z510 Encounter for antineoplastic radiation therapy: Secondary | ICD-10-CM | POA: Diagnosis not present

## 2021-05-20 ENCOUNTER — Ambulatory Visit
Admission: RE | Admit: 2021-05-20 | Discharge: 2021-05-20 | Disposition: A | Discharge status: Home / Self Care | Payer: Medicaid Other | Source: Ambulatory Visit | Attending: Radiation Oncology | Admitting: Radiation Oncology

## 2021-05-20 DIAGNOSIS — Z510 Encounter for antineoplastic radiation therapy: Secondary | ICD-10-CM | POA: Diagnosis not present

## 2021-05-21 ENCOUNTER — Ambulatory Visit
Admission: RE | Admit: 2021-05-21 | Discharge: 2021-05-21 | Disposition: A | Discharge status: Home / Self Care | Payer: Medicaid Other | Source: Ambulatory Visit | Attending: Radiation Oncology | Admitting: Radiation Oncology

## 2021-05-21 ENCOUNTER — Other Ambulatory Visit: Payer: Self-pay

## 2021-05-21 DIAGNOSIS — Z510 Encounter for antineoplastic radiation therapy: Secondary | ICD-10-CM | POA: Diagnosis not present

## 2021-05-22 NOTE — Progress Notes (Signed)
**Note Brittney Price** Patient Care Team: Patient, No Pcp Per (Inactive) as PCP - General (General Practice) Rockwell Germany, RN as Oncology Nurse Navigator Mauro Kaufmann, RN as Oncology Nurse Navigator Donnie Mesa, MD as Consulting Physician (General Surgery) Nicholas Lose, MD as Consulting Physician (Hematology and Oncology) Kyung Rudd, MD as Consulting Physician (Radiation Oncology)  DIAGNOSIS:    ICD-10-CM   1. Ductal carcinoma in situ (DCIS) of right breast  D05.11       SUMMARY OF ONCOLOGIC HISTORY: Oncology History  Ductal carcinoma in situ (DCIS) of right breast  01/15/2021 Initial Diagnosis   Palpable lump in right breast. Diagnostic mammogram and [place] on 01/12/21 showed intermediate right breast calcifications, benign simple cysts in bilateral breast, one of which corresponds with her palpable right breast lump, and no evidence of malignancy bilaterally. Biopsy of the right breast on 01/15/21 showed DCIS with calcification and necrosis, ER+/PR+ (>95%).   01/27/2021 Cancer Staging   Staging form: Breast, AJCC 8th Edition - Clinical stage from 01/27/2021: Stage 0 (cTis (DCIS), cN0, cM0, ER+, PR+) - Signed by Nicholas Lose, MD on 01/27/2021 Stage prefix: Initial diagnosis Nuclear grade: G2   02/03/2021 Genetic Testing   No pathogenic variants detected in [place] Panel or [facility] CancerNext-Expanded +RNAinsight Panel.  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  The report dates are February 03, 2021 and February 09, 2021, respectively.    The BRCAplus panel offered by [employer] and includes sequencing and deletion/duplication analysis for the following 8 genes: ATM, BRCA1, BRCA2, CDH1, CHEK2, PALB2, PTEN, and TP53.  The CancerNext-Expanded gene panel offered by [facility] and includes sequencing, rearrangement, and RNA analysis for the following 77 genes: AIP, ALK, APC, ATM, AXIN2, BAP1, BARD1, BLM, BMPR1A, BRCA1, BRCA2, BRIP1, CDC73, CDH1, CDK4, CDKN1B,  CDKN2A, CHEK2, CTNNA1, DICER1, FANCC, FH, FLCN, GALNT12, KIF1B, LZTR1, MAX, MEN1, MET, MLH1, MSH2, MSH3, MSH6, MUTYH, NBN, NF1, NF2, NTHL1, PALB2, PHOX2B, PMS2, POT1, PRKAR1A, PTCH1, PTEN, RAD51C, RAD51D, RB1, RECQL, RET, SDHA, SDHAF2, SDHB, SDHC, SDHD, SMAD4, SMARCA4, SMARCB1, SMARCE1, STK11, SUFU, TMEM127, TP53, TSC1, TSC2, VHL and XRCC2 (sequencing and deletion/duplication); EGFR, EGLN1, HOXB13, KIT, MITF, PDGFRA, POLD1, and POLE (sequencing only); EPCAM and GREM1 (deletion/duplication only).    03/02/2021 Surgery   Right lumpectomy: intermediate grade DCIS with margins uninvolved by carcinoma, ER greater than 95%, PR greater than 95%   04/15/2021 - 06/01/2021 Radiation Therapy   Adjuvant radiation therapy     CHIEF COMPLIANT: Follow-up up of right breast cancer  INTERVAL HISTORY: Brittney Price is a 45 y.o. with above-mentioned history of DCIS of the right breast having undergone right lumpectomy, currently on radiation therapy. She reports to the clinic today for follow-up.  She has radiation dermatitis but otherwise tolerating it well.  She does feel slightly tired because of the end of radiation.  ALLERGIES:  has No Known Allergies.  MEDICATIONS:  Current Outpatient Medications  Medication Sig Dispense Refill   acetaminophen (TYLENOL) 500 MG tablet Take 500 mg by mouth every 6 (six) hours as needed for moderate pain.     ibuprofen (ADVIL) 200 MG tablet Take 400-600 mg by mouth every 6 (six) hours as needed for moderate pain.     sulfamethoxazole-trimethoprim (BACTRIM DS) 800-160 MG tablet Take 1 tablet by mouth 2 (two) times daily. 6 tablet 0   No current facility-administered medications for this visit.    PHYSICAL EXAMINATION: ECOG PERFORMANCE STATUS: 1 - Symptomatic but completely ambulatory  Vitals:   05/24/21 9242  BP: (!) 95/50  Pulse: 61  Resp: 16  Temp: 97.9 F (36.6 C)  SpO2: 97%   Filed Weights   05/24/21 0828  Weight: 142 lb 9.6 oz (64.7 kg)    BREAST: No  palpable masses or nodules in either right or left breasts. No palpable axillary supraclavicular or infraclavicular adenopathy no breast tenderness or nipple discharge. (exam performed in the presence of a chaperone)  LABORATORY DATA:  I have reviewed the data as listed CMP Latest Ref Rng & Units 02/25/2021 01/27/2021 01/16/2021  Glucose 70 - 99 mg/dL 122(H) 118(H) 124(H)  BUN 6 - 20 mg/dL 11 8 10   Creatinine 0.44 - 1.00 mg/dL 0.70 0.71 0.62  Sodium 135 - 145 mmol/L 139 141 139  Potassium 3.5 - 5.1 mmol/L 3.9 4.1 3.9  Chloride 98 - 111 mmol/L 108 109 109  CO2 22 - 32 mmol/L 27 26 24   Calcium 8.9 - 10.3 mg/dL 8.9 8.5(L) 8.8(L)  Total Protein 6.5 - 8.1 g/dL 6.1(L) 5.9(L) -  Total Bilirubin 0.3 - 1.2 mg/dL 0.7 0.3 -  Alkaline Phos 38 - 126 U/L 47 46 -  AST 15 - 41 U/L 16 12(L) -  ALT 0 - 44 U/L 23 15 -    Lab Results  Component Value Date   WBC 7.3 02/25/2021   HGB 13.4 02/25/2021   HCT 40.6 02/25/2021   MCV 95.8 02/25/2021   PLT 132 (L) 02/25/2021   NEUTROABS 4.2 01/27/2021    ASSESSMENT & PLAN:  Ductal carcinoma in situ (DCIS) of right breast 01/15/2021:Palpable lump in right breast. Diagnostic mammogram and [place] on 01/12/21 showed intermediate right breast calcifications, benign simple cysts in bilateral breast, one of which corresponds with her palpable right breast lump, and no evidence of malignancy bilaterally. Biopsy of the right breast on 01/15/21 showed DCIS with calcification and necrosis, ER+/PR+ (>95%).   Recommendation: 1. Breast conserving surgery: 03/02/21: DCIS IG , Margins Neg 2. Followed by adjuvant radiation therapy 04/15/2021 -06/01/2021  3. Followed by antiestrogen therapy with tamoxifen 5 years.  07/04/2021 Weight loss: Possibly related to gastritis: I instructed her to take omeprazole or Nexium over-the-counter.   Tamoxifen counseling: We discussed the risks and benefits of tamoxifen and she is agreeable to proceed She will start tamoxifen 07/04/2021 Return to  clinic in 3 months for survivorship care plan visit After that I can see her 6 months later and after that she will be seen once a year.    No orders of the defined types were placed in this encounter.  The patient has a good understanding of the overall plan. she agrees with it. she will call with any problems that may develop before the next visit here.  Total time spent: 20 mins including face to face time and time spent for planning, charting and coordination of care  Rulon Eisenmenger, MD, MPH 05/24/2021  I, Thana Ates, am acting as scribe for Dr. Nicholas Lose.  I have reviewed the above documentation for accuracy and completeness, and I agree with the above.

## 2021-05-24 ENCOUNTER — Other Ambulatory Visit: Payer: Self-pay

## 2021-05-24 ENCOUNTER — Inpatient Hospital Stay: Payer: Medicaid Other | Attending: Hematology and Oncology | Admitting: Hematology and Oncology

## 2021-05-24 ENCOUNTER — Ambulatory Visit
Admission: RE | Admit: 2021-05-24 | Discharge: 2021-05-24 | Disposition: A | Discharge status: Home / Self Care | Payer: Medicaid Other | Source: Ambulatory Visit | Attending: Radiation Oncology | Admitting: Radiation Oncology

## 2021-05-24 DIAGNOSIS — D0511 Intraductal carcinoma in situ of right breast: Secondary | ICD-10-CM | POA: Insufficient documentation

## 2021-05-24 DIAGNOSIS — Z510 Encounter for antineoplastic radiation therapy: Secondary | ICD-10-CM | POA: Diagnosis not present

## 2021-05-24 MED ORDER — TAMOXIFEN CITRATE 20 MG PO TABS: 20.0000 mg | ORAL_TABLET | Freq: Every day | ORAL | 3 refills | Status: DC

## 2021-05-24 NOTE — Assessment & Plan Note (Signed)
01/15/2021:Palpable lump in right breast. Diagnostic mammogram and [place] on 01/12/21 showed intermediate right breast calcifications, benign simple cysts in bilateral breast, one of which corresponds with her palpable right breast lump, and no evidence of malignancy bilaterally. Biopsy of the right breast on 01/15/21 showed DCIS with calcification and necrosis, ER+/PR+ (>95%).  Recommendation: 1. Breast conserving surgery: 03/02/21: DCIS IG , Margins Neg 2. Followed by adjuvant radiation therapy 04/15/2021 -06/01/2021  3. Followed by antiestrogen therapy with tamoxifen 5 years Weight loss: Possibly related to gastritis: I instructed her to take omeprazole or Nexium over-the-counter.  Tamoxifen counseling: We discussed the risks and benefits of tamoxifen and she is agreeable to proceed Return to clinic in 3 months for survivorship care plan visit

## 2021-05-25 ENCOUNTER — Ambulatory Visit
Admission: RE | Admit: 2021-05-25 | Discharge: 2021-05-25 | Disposition: A | Discharge status: Home / Self Care | Payer: Medicaid Other | Source: Ambulatory Visit | Attending: Radiation Oncology | Admitting: Radiation Oncology

## 2021-05-25 DIAGNOSIS — Z510 Encounter for antineoplastic radiation therapy: Secondary | ICD-10-CM | POA: Diagnosis not present

## 2021-05-26 ENCOUNTER — Ambulatory Visit: Payer: Medicaid Other

## 2021-05-26 ENCOUNTER — Other Ambulatory Visit: Payer: Self-pay

## 2021-05-26 DIAGNOSIS — Z510 Encounter for antineoplastic radiation therapy: Secondary | ICD-10-CM | POA: Diagnosis not present

## 2021-05-27 ENCOUNTER — Encounter: Payer: Self-pay | Admitting: *Deleted

## 2021-05-27 ENCOUNTER — Ambulatory Visit: Payer: Medicaid Other

## 2021-05-27 DIAGNOSIS — Z510 Encounter for antineoplastic radiation therapy: Secondary | ICD-10-CM | POA: Diagnosis not present

## 2021-05-28 ENCOUNTER — Other Ambulatory Visit: Payer: Self-pay

## 2021-05-28 ENCOUNTER — Ambulatory Visit
Admission: RE | Admit: 2021-05-28 | Discharge: 2021-05-28 | Disposition: A | Discharge status: Home / Self Care | Payer: Medicaid Other | Source: Ambulatory Visit | Attending: Radiation Oncology | Admitting: Radiation Oncology

## 2021-05-28 DIAGNOSIS — Z510 Encounter for antineoplastic radiation therapy: Secondary | ICD-10-CM | POA: Diagnosis not present

## 2021-05-31 ENCOUNTER — Other Ambulatory Visit: Payer: Self-pay

## 2021-05-31 ENCOUNTER — Ambulatory Visit
Admission: RE | Admit: 2021-05-31 | Discharge: 2021-05-31 | Disposition: A | Discharge status: Home / Self Care | Payer: Medicaid Other | Source: Ambulatory Visit | Attending: Radiation Oncology | Admitting: Radiation Oncology

## 2021-05-31 DIAGNOSIS — Z510 Encounter for antineoplastic radiation therapy: Secondary | ICD-10-CM | POA: Diagnosis not present

## 2021-06-01 ENCOUNTER — Ambulatory Visit: Payer: Medicaid Other

## 2021-06-01 ENCOUNTER — Ambulatory Visit
Admission: RE | Admit: 2021-06-01 | Discharge: 2021-06-01 | Disposition: A | Discharge status: Home / Self Care | Payer: Medicaid Other | Source: Ambulatory Visit | Attending: Radiation Oncology | Admitting: Radiation Oncology

## 2021-06-01 ENCOUNTER — Encounter: Payer: Self-pay | Admitting: *Deleted

## 2021-06-01 DIAGNOSIS — Z510 Encounter for antineoplastic radiation therapy: Secondary | ICD-10-CM | POA: Diagnosis not present

## 2021-06-01 DIAGNOSIS — D0511 Intraductal carcinoma in situ of right breast: Secondary | ICD-10-CM

## 2021-06-02 ENCOUNTER — Ambulatory Visit
Admission: RE | Admit: 2021-06-02 | Discharge: 2021-06-02 | Disposition: A | Discharge status: Home / Self Care | Payer: Medicaid Other | Source: Ambulatory Visit | Attending: Radiation Oncology | Admitting: Radiation Oncology

## 2021-06-02 ENCOUNTER — Encounter: Payer: Self-pay | Admitting: Radiation Oncology

## 2021-06-02 ENCOUNTER — Other Ambulatory Visit: Payer: Self-pay

## 2021-06-02 DIAGNOSIS — Z510 Encounter for antineoplastic radiation therapy: Secondary | ICD-10-CM | POA: Diagnosis not present

## 2021-06-16 ENCOUNTER — Telehealth: Payer: Self-pay

## 2021-06-16 NOTE — Telephone Encounter (Signed)
Patient informed [facility] Medicaid approved. MID: [id number] T

## 2021-06-17 NOTE — Progress Notes (Signed)
                                                                                                                                                             Patient Name: [name] MRN: [id number] DOB: Jul 27, 1976 Referring Physician: Serena Croissant (Profile Not Attached) Date of Service: 06/02/2021 [facility], [place]                                                        End Of Treatment Note  Diagnoses: D05.11-Intraductal carcinoma in situ of right breast  Cancer Staging: ER/PR positive, intermediate grade DCIS of the right breast  Intent: Curative  Radiation Treatment Dates: 04/14/2021 through 06/02/2021 Site Technique Total Dose (Gy) Dose per Fx (Gy) Completed Fx Beam Energies  Breast, Right: Breast_Rt 3D 50.4/50.4 1.8 28/28 6XFFF  Breast, Right: Breast_Rt_Bst specialPort 10/10 2 5/5 9E, 12E   Narrative: The patient tolerated radiation therapy relatively well. She developed fatigue and anticipated skin changes in the treatment field.   Plan: The patient will receive a call in about one month from the radiation oncology department. She will continue follow up with Dr. Pamelia Hoit as well.   ________________________________________________    Osker Mason, [facility]

## 2021-06-28 ENCOUNTER — Ambulatory Visit
Admission: RE | Admit: 2021-06-28 | Discharge: 2021-06-28 | Disposition: A | Discharge status: Home / Self Care | Payer: No Typology Code available for payment source | Source: Ambulatory Visit | Attending: Radiation Oncology | Admitting: Radiation Oncology

## 2021-06-28 DIAGNOSIS — D0511 Intraductal carcinoma in situ of right breast: Secondary | ICD-10-CM

## 2021-06-29 NOTE — Progress Notes (Signed)
  Radiation Oncology         [phone]) [phone] ________________________________  Name: Brittney Price MRN: [id number]  Date of Service: 06/28/2021  DOB: 06-28-1976  Post Treatment Telephone Note  Diagnosis:   ER/PR positive, intermediate grade DCIS of the right breast  Interval Since Last Radiation:  4 weeks   04/14/2021 through 06/02/2021 Site Technique Total Dose (Gy) Dose per Fx (Gy) Completed Fx Beam Energies  Breast, Right: Breast_Rt 3D 50.4/50.4 1.8 28/28 6XFFF  Breast, Right: Breast_Rt_Bst specialPort 10/10 2 5/5 9E, 12E    Narrative:  The patient was contacted today for routine follow-up. During treatment she did very well with radiotherapy and did not have significant desquamation. She reports she is doing well overall and her skin has healed up since radiation finished. She's working on getting her medicaid card so she can fill her tamoxifen prescription.  Impression/Plan: 1. ER/PR positive, intermediate grade DCIS of the right breast. The patient has been doing well since completion of radiotherapy. We discussed that we would be happy to continue to follow her as needed, but she will also continue to follow up with Dr. Pamelia Hoit in medical oncology. She was counseled on skin care as well as measures to avoid sun exposure to this area.  2. Survivorship. We discussed the importance of survivorship evaluation and encouraged her to attend her upcoming visit with that clinic.       Osker Mason, PAC

## 2021-08-04 ENCOUNTER — Other Ambulatory Visit: Payer: Self-pay | Admitting: Obstetrics and Gynecology

## 2021-08-04 DIAGNOSIS — Z1231 Encounter for screening mammogram for malignant neoplasm of breast: Secondary | ICD-10-CM

## 2021-08-08 HISTORY — PX: BREAST BIOPSY: SHX20

## 2021-08-19 ENCOUNTER — Telehealth: Payer: Self-pay | Admitting: *Deleted

## 2021-08-19 LAB — RESULTS CONSOLE HPV: CHL HPV: NEGATIVE

## 2021-08-19 LAB — HM PAP SMEAR

## 2021-08-24 ENCOUNTER — Other Ambulatory Visit: Payer: Self-pay

## 2021-08-24 ENCOUNTER — Inpatient Hospital Stay: Payer: Medicaid Other | Attending: Hematology and Oncology | Admitting: Adult Health

## 2021-08-24 ENCOUNTER — Inpatient Hospital Stay: Payer: Medicaid Other

## 2021-08-24 ENCOUNTER — Encounter: Payer: Self-pay | Admitting: Adult Health

## 2021-08-24 VITALS — BP 131/61 | HR 66 | Temp 97.9°F | Resp 18 | Ht 68.0 in | Wt 142.2 lb

## 2021-08-24 DIAGNOSIS — R911 Solitary pulmonary nodule: Secondary | ICD-10-CM | POA: Insufficient documentation

## 2021-08-24 DIAGNOSIS — Z923 Personal history of irradiation: Secondary | ICD-10-CM | POA: Insufficient documentation

## 2021-08-24 DIAGNOSIS — R922 Inconclusive mammogram: Secondary | ICD-10-CM

## 2021-08-24 DIAGNOSIS — N6320 Unspecified lump in the left breast, unspecified quadrant: Secondary | ICD-10-CM | POA: Insufficient documentation

## 2021-08-24 DIAGNOSIS — R10811 Right upper quadrant abdominal tenderness: Secondary | ICD-10-CM | POA: Insufficient documentation

## 2021-08-24 DIAGNOSIS — F17210 Nicotine dependence, cigarettes, uncomplicated: Secondary | ICD-10-CM | POA: Diagnosis not present

## 2021-08-24 DIAGNOSIS — R109 Unspecified abdominal pain: Secondary | ICD-10-CM

## 2021-08-24 DIAGNOSIS — D0511 Intraductal carcinoma in situ of right breast: Secondary | ICD-10-CM | POA: Insufficient documentation

## 2021-08-24 DIAGNOSIS — N6321 Unspecified lump in the left breast, upper outer quadrant: Secondary | ICD-10-CM | POA: Diagnosis not present

## 2021-08-24 DIAGNOSIS — N6002 Solitary cyst of left breast: Secondary | ICD-10-CM

## 2021-08-24 NOTE — Progress Notes (Signed)
SURVIVORSHIP VISIT:  BRIEF ONCOLOGIC HISTORY:  Oncology History  Ductal carcinoma in situ (DCIS) of right breast  01/15/2021 Initial Diagnosis   Palpable lump in right breast. Diagnostic mammogram and [place] on 01/12/21 showed intermediate right breast calcifications, benign simple cysts in bilateral breast, one of which corresponds with her palpable right breast lump, and no evidence of malignancy bilaterally. Biopsy of the right breast on 01/15/21 showed DCIS with calcification and necrosis, ER+/PR+ (>95%).   01/27/2021 Cancer Staging   Staging form: Breast, AJCC 8th Edition - Clinical stage from 01/27/2021: Stage 0 (cTis (DCIS), cN0, cM0, ER+, PR+) - Signed by Nicholas Lose, MD on 01/27/2021 Stage prefix: Initial diagnosis Nuclear grade: G2    02/03/2021 Genetic Testing   No pathogenic variants detected in [place] Panel or [facility] CancerNext-Expanded +RNAinsight Panel.  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  The report dates are February 03, 2021 and February 09, 2021, respectively.    The BRCAplus panel offered by [employer] and includes sequencing and deletion/duplication analysis for the following 8 genes: ATM, BRCA1, BRCA2, CDH1, CHEK2, PALB2, PTEN, and TP53.  The CancerNext-Expanded gene panel offered by [facility] and includes sequencing, rearrangement, and RNA analysis for the following 77 genes: AIP, ALK, APC, ATM, AXIN2, BAP1, BARD1, BLM, BMPR1A, BRCA1, BRCA2, BRIP1, CDC73, CDH1, CDK4, CDKN1B, CDKN2A, CHEK2, CTNNA1, DICER1, FANCC, FH, FLCN, GALNT12, KIF1B, LZTR1, MAX, MEN1, MET, MLH1, MSH2, MSH3, MSH6, MUTYH, NBN, NF1, NF2, NTHL1, PALB2, PHOX2B, PMS2, POT1, PRKAR1A, PTCH1, PTEN, RAD51C, RAD51D, RB1, RECQL, RET, SDHA, SDHAF2, SDHB, SDHC, SDHD, SMAD4, SMARCA4, SMARCB1, SMARCE1, STK11, SUFU, TMEM127, TP53, TSC1, TSC2, VHL and XRCC2 (sequencing and deletion/duplication); EGFR, EGLN1, HOXB13, KIT, MITF, PDGFRA, POLD1, and POLE (sequencing only); EPCAM and  GREM1 (deletion/duplication only).    03/02/2021 Surgery   Right lumpectomy: intermediate grade DCIS with margins uninvolved by carcinoma, ER greater than 95%, PR greater than 95%   03/02/2021 Cancer Staging   Staging form: Breast, AJCC 8th Edition - Pathologic stage from 03/02/2021: Stage 0 (pTis (DCIS), pN0, cM0, ER+, PR+) - Signed by Gardenia Phlegm, NP on 08/13/2021 Stage prefix: Initial diagnosis Nuclear grade: G2    04/15/2021 - 06/01/2021 Radiation Therapy   Adjuvant radiation therapy   07/04/2021 -  Anti-estrogen oral therapy   Tamoxifen     INTERVAL HISTORY:  Brittney Price to review her survivorship care plan detailing her treatment course for breast cancer, as well as monitoring long-term side effects of that treatment, education regarding health maintenance, screening, and overall wellness and health promotion.     Overall, Brittney Price reports feeling quite well.  She is taking tamoxifen daily.  She is doing moderately well, however does have some tingling and vibration feeling above her right nipple.  She has range of motion difficulty in her right arm.    REVIEW OF SYSTEMS:  Review of Systems  Constitutional:  Negative for appetite change, chills, fatigue, fever and unexpected weight change.  HENT:   Negative for hearing loss, lump/mass and trouble swallowing.   Eyes:  Negative for eye problems and icterus.  Respiratory:  Negative for chest tightness, cough and shortness of breath.   Cardiovascular:  Negative for chest pain, leg swelling and palpitations.  Gastrointestinal:  Negative for abdominal distention, abdominal pain, constipation, diarrhea, nausea and vomiting.  Endocrine: Negative for hot flashes.  Genitourinary:  Negative for difficulty urinating.   Musculoskeletal:  Negative for arthralgias.  Skin:  Negative for itching and rash.  Neurological:  Negative for  dizziness, extremity weakness, headaches and numbness.  Hematological:  Negative for adenopathy. Does  not bruise/bleed easily.  Psychiatric/Behavioral:  Negative for depression. The patient is not nervous/anxious.   Breast: Denies any new nodularity, masses, tenderness, nipple changes, or nipple discharge.      ONCOLOGY TREATMENT TEAM:  1. Surgeon:  Dr. Georgette Dover at [facility] 2. Medical Oncologist: Dr. Lindi Adie  3. Radiation Oncologist: Dr. Lisbeth Renshaw    PAST MEDICAL/SURGICAL HISTORY:  Past Medical History:  Diagnosis Date   Family history of breast cancer 01/27/2021   Family history of colon cancer 01/27/2021   Past Surgical History:  Procedure Laterality Date   ANKLE SURGERY     BREAST LUMPECTOMY WITH RADIOACTIVE SEED LOCALIZATION Right 03/02/2021   Procedure: RIGHT BREAST LUMPECTOMY WITH RADIOACTIVE SEED LOCALIZATION;  Surgeon: Donnie Mesa, MD;  Location: [place];  Service: General;  Laterality: Right;     ALLERGIES:  No Known Allergies   CURRENT MEDICATIONS:  Outpatient Encounter Medications as of 08/24/2021  Medication Sig   acetaminophen (TYLENOL) 500 MG tablet Take 500 mg by mouth every 6 (six) hours as needed for moderate pain.   tamoxifen (NOLVADEX) 20 MG tablet Take 1 tablet (20 mg total) by mouth daily.   ibuprofen (ADVIL) 200 MG tablet Take 400-600 mg by mouth every 6 (six) hours as needed for moderate pain. (Patient not taking: Reported on 08/24/2021)   [DISCONTINUED] fluticasone (FLONASE) 50 MCG/ACT nasal spray Place 1 spray into both nostrils daily. (Patient not taking: No sig reported)   [DISCONTINUED] sulfamethoxazole-trimethoprim (BACTRIM DS) 800-160 MG tablet Take 1 tablet by mouth 2 (two) times daily.   No facility-administered encounter medications on file as of 08/24/2021.     ONCOLOGIC FAMILY HISTORY:  Family History  Problem Relation Age of Onset   Throat cancer Father        d. 67   Cancer Maternal Aunt        unknown type; dx 72s   Breast cancer Maternal Grandmother 22   Colon cancer Maternal Grandmother        dx mid-late 65s      GENETIC COUNSELING/TESTING: See above  SOCIAL HISTORY:  Social History   Socioeconomic History   Marital status: Married    Spouse name: Not on file   Number of children: 3   Years of education: Not on file   Highest education level: 9th grade  Occupational History   Not on file  Tobacco Use   Smoking status: Every Day    Packs/day: 0.50    Types: Cigarettes   Smokeless tobacco: Never  Vaping Use   Vaping Use: Never used  Substance and Sexual Activity   Alcohol use: Yes    Comment: occasional   Drug use: Yes    Frequency: 2.0 times per week    Types: Marijuana   Sexual activity: Yes  Other Topics Concern   Not on file  Social History Narrative   Not on file   Social Determinants of Health   Financial Resource Strain: Low Risk    Difficulty of Paying Living Expenses: Not very hard  Food Insecurity: No Food Insecurity   Worried About Charity fundraiser in the Last Year: Never true   Ran Out of Food in the Last Year: Never true  Transportation Needs: No Transportation Needs   Lack of Transportation (Medical): No   Lack of Transportation (Non-Medical): No  Physical Activity: Inactive   Days of Exercise per Week: 0 days   Minutes of  Exercise per Session: 0 min  Stress: Stress Concern Present   Feeling of Stress : Very much  Social Connections: Socially Isolated   Frequency of Communication with Friends and Family: More than three times a week   Frequency of Social Gatherings with Friends and Family: Three times a week   Attends Religious Services: Never   Active Member of Clubs or Organizations: No   Attends Archivist Meetings: Never   Marital Status: Never married  Human resources officer Violence: Not At Risk   Fear of Current or Ex-Partner: No   Emotionally Abused: No   Physically Abused: No   Sexually Abused: No     OBSERVATIONS/OBJECTIVE:  BP 131/61 (BP Location: Left Arm, Patient Position: Sitting)    Pulse 66    Temp 97.9 F (36.6 C)  (Temporal)    Resp 18    Ht 5' 8"  (1.727 m)    Wt 142 lb 3 oz (64.5 kg)    SpO2 100%    BMI 21.62 kg/m  GENERAL: Patient is a well appearing female in no acute distress HEENT:  Sclerae anicteric.  Oropharynx clear and moist. No ulcerations or evidence of oropharyngeal candidiasis. Neck is supple.  NODES:  No cervical, supraclavicular, or axillary lymphadenopathy palpated.  BREAST EXAM:  right breast s/p lumpectomy and radiation therapy, no sign of local recurrence, left breast benign LUNGS:  Clear to auscultation bilaterally.  No wheezes or rhonchi. HEART:  Regular rate and rhythm. No murmur appreciated. ABDOMEN:  soft, + tenderness and guarding to RUQ, no other tenderness, + bowel sounds, no CVA tenderness MSK:  No focal spinal tenderness to palpation. Full range of motion bilaterally in the upper extremities. EXTREMITIES:  No peripheral edema.   SKIN:  Clear with no obvious rashes or skin changes. No nail dyscrasia. NEURO:  Nonfocal. Well oriented.  Appropriate affect.   LABORATORY DATA:  None for this visit.  DIAGNOSTIC IMAGING:  None for this visit.      ASSESSMENT AND PLAN:  Ms.. Winborne is a pleasant 46 y.o. female with Stage 0 right breast DCIS, ER+/PR+, diagnosed in 01/2021, treated with lumpectomy, adjuvant radiation therapy, and anti-estrogen therapy with Tamoxifen beginning in 05/2021.  She presents to the Survivorship Clinic for our initial meeting and routine follow-up post-completion of treatment for breast cancer.    1. Stage 0 right breast cancer:  Ms. Jacuinde is continuing to recover from definitive treatment for breast cancer. She will follow-up with her medical oncologist, Dr. Lindi Adie in  with history and physical exam per surveillance protocol.  She will continue her anti-estrogen therapy with Tamoxifen. Thus far, she is tolerating the Tamoxifen well, with minimal side effects. She was instructed to make Dr. Lindi Adie or myself aware if she begins to experience any worsening  side effects of the medication and I could see her back in clinic to help manage those side effects, as needed. Her mammogram is due 01/2022; orders placed today.  Her breast density is category D.   Today, a comprehensive survivorship care plan and treatment summary was reviewed with the patient today detailing her breast cancer diagnosis, treatment course, potential late/long-term effects of treatment, appropriate follow-up care with recommendations for the future, and patient education resources.  A copy of this summary, along with a letter will be sent to the patients primary care provider via mail/fax/In Basket message after todays visit.    2. Breast Density Category D: We discussed her breast density in detail and I reviewed images of the  breast density category.  I reviewed the recommendation for mammogram in June and breast MRI in December as supplemental breast cancer screening considering her very dense breasts.  3. 43m Lung Nodule: This 3 mm nodule was noted on CTA chest that was completed in June 2022.  Considering her smoking history along with breast cancer history I reached out to the reading radiologist to get information on her thoughts on when best to repeat CT chest.  4. RUQ tenderness: Will add on lab testing with CBC CMP and urinalysis.  Her last bowel movement was yesterday.  And she does have positive bowel sounds.  She notes that she has been taking Tylenol for this tenderness that was noted on exam.  She has no CVA tenderness.  She also denies dysuria.  5.  Left breast nodule: This is compatible with a cyst however has been bothering the patient increasingly lately.  I have ordered a diagnostic mammogram and ultrasound to be completed at the breast center.  6. Bone health:    She was given education on specific activities to promote bone health.  7. Cancer screening:  Due to Ms. Fletchall's history and her age, she should receive screening for skin cancers, colon cancer, and  gynecologic cancers.  The information and recommendations are listed on the patient's comprehensive care plan/treatment summary and were reviewed in detail with the patient.    8. Health maintenance and wellness promotion: Ms. SDoepkewas encouraged to consume 5-7 servings of fruits and vegetables per day. We reviewed the "Nutrition Rainbow" handout.  She was also encouraged to engage in moderate to vigorous exercise for 30 minutes per day most days of the week. We discussed the [employer] fitness program, which is designed for cancer survivors to help them become more physically fit after cancer treatments.  She was instructed to limit her alcohol consumption and we discussed smoking cessation..     9. Support services/counseling: It is not uncommon for this period of the patient's cancer care trajectory to be one of many emotions and stressors.   She was given information regarding our available services and encouraged to contact me with any questions or for help enrolling in any of our support group/programs.    Follow up instructions:    -Return to cancer center 6 months for f/u with Dr. GLindi Adie -Mammogram due in 01/2022 -Breast MRI in December 2023. -Follow up with surgery 1 year -She is welcome to return back to the Survivorship Clinic at any time; no additional follow-up needed at this time.  -Consider referral back to survivorship as a long-term survivor for continued surveillance  The patient was provided an opportunity to ask questions and all were answered. The patient agreed with the plan and demonstrated an understanding of the instructions.   Total encounter time: 60 minutes in face to face visit time, chart review, lab review, care coordination, and documentation of the encounter.    LWilber Bihari NP 08/24/21 9:03 AM Medical Oncology and Hematology [facility] [place] [id number]. [phone]   Fax. [phone] *Total Encounter  Time as defined by the Centers for Medicare and Medicaid Services includes, in addition to the face-to-face time of a patient visit (documented in the note above) non-face-to-face time: obtaining and reviewing outside history, ordering and reviewing medications, tests or procedures, care coordination (communications with other health care professionals or caregivers) and documentation in the medical record.

## 2021-08-25 ENCOUNTER — Inpatient Hospital Stay: Payer: Medicaid Other

## 2021-08-25 ENCOUNTER — Telehealth: Payer: Self-pay

## 2021-08-25 DIAGNOSIS — D0511 Intraductal carcinoma in situ of right breast: Secondary | ICD-10-CM | POA: Diagnosis not present

## 2021-08-25 LAB — CBC WITH DIFFERENTIAL (CANCER CENTER ONLY)
Abs Immature Granulocytes: 0.01 10*3/uL (ref 0.00–0.07)
Basophils Absolute: 0 10*3/uL (ref 0.0–0.1)
Basophils Relative: 0 %
Eosinophils Absolute: 0.2 10*3/uL (ref 0.0–0.5)
Eosinophils Relative: 2 %
HCT: 34.4 % — ABNORMAL LOW (ref 36.0–46.0)
Hemoglobin: 11.7 g/dL — ABNORMAL LOW (ref 12.0–15.0)
Immature Granulocytes: 0 %
Lymphocytes Relative: 16 %
Lymphs Abs: 1.4 10*3/uL (ref 0.7–4.0)
MCH: 31.8 pg (ref 26.0–34.0)
MCHC: 34 g/dL (ref 30.0–36.0)
MCV: 93.5 fL (ref 80.0–100.0)
Monocytes Absolute: 0.7 10*3/uL (ref 0.1–1.0)
Monocytes Relative: 8 %
Neutro Abs: 6.4 10*3/uL (ref 1.7–7.7)
Neutrophils Relative %: 74 %
Platelet Count: 115 10*3/uL — ABNORMAL LOW (ref 150–400)
RBC: 3.68 MIL/uL — ABNORMAL LOW (ref 3.87–5.11)
RDW: 11.8 % (ref 11.5–15.5)
WBC Count: 8.8 10*3/uL (ref 4.0–10.5)
nRBC: 0 % (ref 0.0–0.2)

## 2021-08-25 LAB — URINALYSIS, COMPLETE (UACMP) WITH MICROSCOPIC
Bilirubin Urine: NEGATIVE
Glucose, UA: NEGATIVE mg/dL
Hgb urine dipstick: NEGATIVE
Ketones, ur: NEGATIVE mg/dL
Leukocytes,Ua: NEGATIVE
Nitrite: NEGATIVE
Protein, ur: NEGATIVE mg/dL
Specific Gravity, Urine: 1.02 (ref 1.005–1.030)
pH: 5 (ref 5.0–8.0)

## 2021-08-25 LAB — CMP (CANCER CENTER ONLY)
ALT: 12 U/L (ref 0–44)
AST: 11 U/L — ABNORMAL LOW (ref 15–41)
Albumin: 3.8 g/dL (ref 3.5–5.0)
Alkaline Phosphatase: 40 U/L (ref 38–126)
Anion gap: 5 (ref 5–15)
BUN: 10 mg/dL (ref 6–20)
CO2: 26 mmol/L (ref 22–32)
Calcium: 8.7 mg/dL — ABNORMAL LOW (ref 8.9–10.3)
Chloride: 110 mmol/L (ref 98–111)
Creatinine: 0.6 mg/dL (ref 0.44–1.00)
GFR, Estimated: >60 mL/min (ref >60)
Glucose, Bld: 112 mg/dL — ABNORMAL HIGH (ref 70–99)
Potassium: 3.9 mmol/L (ref 3.5–5.1)
Sodium: 141 mmol/L (ref 135–145)
Total Bilirubin: 0.4 mg/dL (ref 0.3–1.2)
Total Protein: 6 g/dL — ABNORMAL LOW (ref 6.5–8.1)

## 2021-08-25 NOTE — Telephone Encounter (Signed)
-----   Message from Gardenia Phlegm, NP sent at 08/25/2021 10:33 AM EST ----- Please let patient know that there is no identifiable reason for pain on her lab work.  REcommend f/u with PCP if it continues ----- Message ----- From: Interface, Lab In [place] Sent: 08/25/2021   8:25 AM EST To: Gardenia Phlegm, NP

## 2021-08-25 NOTE — Telephone Encounter (Signed)
Called pt per NP. Pt states she is still experiencing pain and agrees to contact PCP for f/u. Pt knows to call [facility] with any concerns going forward.

## 2021-08-26 LAB — URINE CULTURE: Culture: NO GROWTH

## 2021-08-27 ENCOUNTER — Ambulatory Visit
Admission: RE | Admit: 2021-08-27 | Discharge: 2021-08-27 | Disposition: A | Discharge status: Home / Self Care | Payer: No Typology Code available for payment source | Source: Ambulatory Visit | Attending: Adult Health | Admitting: Adult Health

## 2021-08-27 ENCOUNTER — Other Ambulatory Visit: Payer: Self-pay

## 2021-08-27 ENCOUNTER — Ambulatory Visit
Admission: RE | Admit: 2021-08-27 | Discharge: 2021-08-27 | Disposition: A | Discharge status: Home / Self Care | Payer: Medicaid Other | Source: Ambulatory Visit | Attending: Adult Health | Admitting: Adult Health

## 2021-08-27 DIAGNOSIS — D0511 Intraductal carcinoma in situ of right breast: Secondary | ICD-10-CM

## 2021-08-27 DIAGNOSIS — N6002 Solitary cyst of left breast: Secondary | ICD-10-CM

## 2021-11-17 ENCOUNTER — Encounter (HOSPITAL_COMMUNITY): Payer: Self-pay

## 2022-01-13 ENCOUNTER — Ambulatory Visit: Payer: No Typology Code available for payment source

## 2022-01-17 ENCOUNTER — Other Ambulatory Visit: Payer: Self-pay | Admitting: Physician Assistant

## 2022-01-17 DIAGNOSIS — Z9889 Other specified postprocedural states: Secondary | ICD-10-CM

## 2022-01-20 ENCOUNTER — Ambulatory Visit
Admission: RE | Admit: 2022-01-20 | Discharge: 2022-01-20 | Disposition: A | Discharge status: Home / Self Care | Payer: Medicaid Other | Source: Ambulatory Visit | Attending: Physician Assistant | Admitting: Physician Assistant

## 2022-01-20 ENCOUNTER — Other Ambulatory Visit: Payer: Self-pay | Admitting: Physician Assistant

## 2022-01-20 DIAGNOSIS — Z9889 Other specified postprocedural states: Secondary | ICD-10-CM

## 2022-01-20 DIAGNOSIS — R921 Mammographic calcification found on diagnostic imaging of breast: Secondary | ICD-10-CM

## 2022-01-24 ENCOUNTER — Ambulatory Visit
Admission: RE | Admit: 2022-01-24 | Discharge: 2022-01-24 | Disposition: A | Discharge status: Home / Self Care | Payer: Medicaid Other | Source: Ambulatory Visit | Attending: Physician Assistant | Admitting: Physician Assistant

## 2022-01-24 DIAGNOSIS — R921 Mammographic calcification found on diagnostic imaging of breast: Secondary | ICD-10-CM

## 2022-02-09 NOTE — Progress Notes (Signed)
Patient Care Team: Berna Bue as PCP - General (Physician Assistant) Donnie Mesa, MD as Consulting Physician (General Surgery) Nicholas Lose, MD as Consulting Physician (Hematology and Oncology) Kyung Rudd, MD as Consulting Physician (Radiation Oncology)  DIAGNOSIS:  Encounter Diagnosis  Name Primary?   Ductal carcinoma in situ (DCIS) of right breast     SUMMARY OF ONCOLOGIC HISTORY: Oncology History  Ductal carcinoma in situ (DCIS) of right breast  01/15/2021 Initial Diagnosis   Palpable lump in right breast. Diagnostic mammogram and [place] on 01/12/21 showed intermediate right breast calcifications, benign simple cysts in bilateral breast, one of which corresponds with her palpable right breast lump, and no evidence of malignancy bilaterally. Biopsy of the right breast on 01/15/21 showed DCIS with calcification and necrosis, ER+/PR+ (>95%).   01/27/2021 Cancer Staging   Staging form: Breast, AJCC 8th Edition - Clinical stage from 01/27/2021: Stage 0 (cTis (DCIS), cN0, cM0, ER+, PR+) - Signed by Nicholas Lose, MD on 01/27/2021 Stage prefix: Initial diagnosis Nuclear grade: G2   02/03/2021 Genetic Testing   No pathogenic variants detected in [place] Panel or [facility] CancerNext-Expanded +RNAinsight Panel.  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  The report dates are February 03, 2021 and February 09, 2021, respectively.    The BRCAplus panel offered by [employer] and includes sequencing and deletion/duplication analysis for the following 8 genes: ATM, BRCA1, BRCA2, CDH1, CHEK2, PALB2, PTEN, and TP53.  The CancerNext-Expanded gene panel offered by [facility] and includes sequencing, rearrangement, and RNA analysis for the following 77 genes: AIP, ALK, APC, ATM, AXIN2, BAP1, BARD1, BLM, BMPR1A, BRCA1, BRCA2, BRIP1, CDC73, CDH1, CDK4, CDKN1B, CDKN2A, CHEK2, CTNNA1, DICER1, FANCC, FH, FLCN, GALNT12, KIF1B, LZTR1, MAX, MEN1, MET, MLH1, MSH2,  MSH3, MSH6, MUTYH, NBN, NF1, NF2, NTHL1, PALB2, PHOX2B, PMS2, POT1, PRKAR1A, PTCH1, PTEN, RAD51C, RAD51D, RB1, RECQL, RET, SDHA, SDHAF2, SDHB, SDHC, SDHD, SMAD4, SMARCA4, SMARCB1, SMARCE1, STK11, SUFU, TMEM127, TP53, TSC1, TSC2, VHL and XRCC2 (sequencing and deletion/duplication); EGFR, EGLN1, HOXB13, KIT, MITF, PDGFRA, POLD1, and POLE (sequencing only); EPCAM and GREM1 (deletion/duplication only).    03/02/2021 Surgery   Right lumpectomy: intermediate grade DCIS with margins uninvolved by carcinoma, ER greater than 95%, PR greater than 95%   03/02/2021 Cancer Staging   Staging form: Breast, AJCC 8th Edition - Pathologic stage from 03/02/2021: Stage 0 (pTis (DCIS), pN0, cM0, ER+, PR+) - Signed by Gardenia Phlegm, NP on 08/13/2021 Stage prefix: Initial diagnosis Nuclear grade: G2   04/15/2021 - 06/01/2021 Radiation Therapy   Adjuvant radiation therapy   07/04/2021 -  Anti-estrogen oral therapy   Tamoxifen     CHIEF COMPLIANT: Follow-up up of right breast cancer on tamoxifen    INTERVAL HISTORY: Brittney Price is a  46 y.o. with above-mentioned history of DCIS of the right breast. She reports to the clinic today for follow-up. She denies hot flashes and joint stiffness. States that she does have some discomfort in the breast.  She is tolerating tamoxifen extremely well without any problems or concerns.  She had an abnormal mammogram that required a biopsy which revealed scar tissue.  A 26-monthmammogram is being recommended.   ALLERGIES:  has No Known Allergies.  MEDICATIONS:  Current Outpatient Medications  Medication Sig Dispense Refill   hydrOXYzine (ATARAX) 10 MG tablet Take by mouth.     acetaminophen (TYLENOL) 500 MG tablet Take 500 mg by mouth every 6 (six) hours as needed for moderate pain.  gabapentin (NEURONTIN) 300 MG capsule Take 300 mg by mouth daily.     ibuprofen (ADVIL) 200 MG tablet Take 400-600 mg by mouth every 6 (six) hours as needed for moderate pain. (Patient  not taking: Reported on 08/24/2021)     Multiple Vitamins-Minerals (MULTIVITAMIN WITH MINERALS) tablet Take 1 tablet by mouth daily.     tamoxifen (NOLVADEX) 20 MG tablet Take 1 tablet (20 mg total) by mouth daily. 90 tablet 3   No current facility-administered medications for this visit.    PHYSICAL EXAMINATION: ECOG PERFORMANCE STATUS: 1 - Symptomatic but completely ambulatory  Vitals:   02/22/22 0817  BP: (!) 98/56  Pulse: 62  Resp: 18  Temp: 97.7 F (36.5 C)  SpO2: 98%   Filed Weights   02/22/22 0817  Weight: 143 lb 14.4 oz (65.3 kg)    BREAST: No palpable masses or nodules in either right or left breasts. No palpable axillary supraclavicular or infraclavicular adenopathy no breast tenderness or nipple discharge. (exam performed in the presence of a chaperone)  LABORATORY DATA:  I have reviewed the data as listed    Latest Ref Rng & Units 08/25/2021    8:09 AM 02/25/2021    9:08 AM 01/27/2021    8:18 AM  CMP  Glucose 70 - 99 mg/dL 112  122  118   BUN 6 - 20 mg/dL _0 Creatinine 0.44 - 1.00 mg/dL 0.60  0.70  0.71   Sodium 135 - 145 mmol/L 141  139  141   Potassium 3.5 - 5.1 mmol/L 3.9  3.9  4.1   Chloride 98 - 111 mmol/L 110  108  109   CO2 22 - 32 mmol/L _1 Calcium 8.9 - 10.3 mg/dL 8.7  8.9  8.5   Total Protein 6.5 - 8.1 g/dL 6.0  6.1  5.9   Total Bilirubin 0.3 - 1.2 mg/dL 0.4  0.7  0.3   Alkaline Phos 38 - 126 U/L 40  47  46   AST 15 - 41 U/L _2 ALT 0 - 44 U/L _3 Lab Results  Component Value Date   WBC 8.8 08/25/2021   HGB 11.7 (L) 08/25/2021   HCT 34.4 (L) 08/25/2021   MCV 93.5 08/25/2021   PLT 115 (L) 08/25/2021   NEUTROABS 6.4 08/25/2021    ASSESSMENT & PLAN:  Ductal carcinoma in situ (DCIS) of right breast 01/15/2021:Palpable lump in right breast. Diagnostic mammogram and [place] on 01/12/21 showed intermediate right breast calcifications, benign simple cysts in bilateral breast, one of which corresponds with her  palpable right breast lump, and no evidence of malignancy bilaterally. Biopsy of the right breast on 01/15/21 showed DCIS with calcification and necrosis, ER+/PR+ (>95%).   Recommendation: 1. Breast conserving surgery: 03/02/21: DCIS IG , Margins Neg 2. Followed by adjuvant radiation therapy 04/15/2021 -06/01/2021  3. Followed by antiestrogen therapy with tamoxifen 5 years.  07/04/2021 Weight loss: Possibly related to gastritis: I instructed her to take omeprazole or Nexium over-the-counter.   Tamoxifen toxicities: Denies any adverse effects to tamoxifen.  Breast cancer surveillance: 1.  Mammogram: Abnormality right breast: Biopsy 01/24/2022: Benign a 69-monthmammogram is being recommended 2. breast exam 02/22/2022: Benign  Return to clinic in 1 year for follow-up    No orders of the defined types were placed in this encounter.  The patient has a good understanding of  the overall plan. she agrees with it. she will call with any problems that may develop before the next visit here. Total time spent: 30 mins including face to face time and time spent for planning, charting and co-ordination of care   Harriette Ohara, MD 02/22/22    I Gardiner Coins am scribing for Dr. Lindi Adie  I have reviewed the above documentation for accuracy and completeness, and I agree with the above.

## 2022-02-22 ENCOUNTER — Other Ambulatory Visit: Payer: Self-pay

## 2022-02-22 ENCOUNTER — Inpatient Hospital Stay: Payer: Medicaid Other | Attending: Hematology and Oncology | Admitting: Hematology and Oncology

## 2022-02-22 DIAGNOSIS — Z79810 Long term (current) use of selective estrogen receptor modulators (SERMs): Secondary | ICD-10-CM | POA: Diagnosis not present

## 2022-02-22 DIAGNOSIS — Z923 Personal history of irradiation: Secondary | ICD-10-CM | POA: Diagnosis not present

## 2022-02-22 DIAGNOSIS — Z79899 Other long term (current) drug therapy: Secondary | ICD-10-CM | POA: Insufficient documentation

## 2022-02-22 DIAGNOSIS — D0511 Intraductal carcinoma in situ of right breast: Secondary | ICD-10-CM | POA: Diagnosis present

## 2022-02-22 DIAGNOSIS — Z803 Family history of malignant neoplasm of breast: Secondary | ICD-10-CM | POA: Insufficient documentation

## 2022-02-22 NOTE — Assessment & Plan Note (Addendum)
01/15/2021:Palpable lump in right breast. Diagnostic mammogram and [place] on 01/12/21 showed intermediate right breast calcifications, benign simple cysts in bilateral breast, one of which corresponds with her palpable right breast lump, and no evidence of malignancy bilaterally. Biopsy of the right breast on 01/15/21 showed DCIS with calcification and necrosis, ER+/PR+ (>95%).  Recommendation: 1. Breast conserving surgery: 03/02/21: DCIS IG , Margins Neg 2. Followed by adjuvant radiation therapy 04/15/2021 -06/01/2021  3. Followed by antiestrogen therapy with tamoxifen 5 years.  07/04/2021 Weight loss: Possibly related to gastritis: I instructed her to take omeprazole or Nexium over-the-counter.  Tamoxifen toxicities: Denies any adverse effects to tamoxifen.  Breast cancer surveillance: 1.  Mammogram: Abnormality right breast: Biopsy 01/24/2022: Benign a 43-month mammogram is being recommended 2. breast exam 02/22/2022: Benign  Return to clinic in 1 year for follow-up

## 2022-02-28 ENCOUNTER — Ambulatory Visit (INDEPENDENT_AMBULATORY_CARE_PROVIDER_SITE_OTHER): Payer: Medicaid Other | Admitting: Orthopaedic Surgery

## 2022-02-28 ENCOUNTER — Ambulatory Visit (INDEPENDENT_AMBULATORY_CARE_PROVIDER_SITE_OTHER): Payer: Medicaid Other

## 2022-02-28 ENCOUNTER — Encounter: Payer: Self-pay | Admitting: Orthopaedic Surgery

## 2022-02-28 DIAGNOSIS — M25571 Pain in right ankle and joints of right foot: Secondary | ICD-10-CM | POA: Diagnosis not present

## 2022-02-28 NOTE — Progress Notes (Signed)
The patient is a 46 year old active and thin female who has right ankle pain and stiffness.  In 2008 she had a injury that was a ankle fracture dislocation.  She was taken to the operating room in December 2008 for open reduction/internal fixation of a complex ankle fracture.  There is hardware laterally and medially.  She eventually had done well postoperatively for a long period of time but then developed ankle pain and stiffness.  She still works through all of this pain.  Again she is not a diabetic and is very mobile and active.  She reports daily right ankle pain as well as swelling and stiffness.  Examination right ankle shows well-healed medial and lateral incisions.  There is no redness or evidence of infection.  She has significant deficits in range of motion of the ankle joint.  She is neurovascularly intact and foot is well-perfused.  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  I would like to send her to one of the tertiary care centers to a foot and ankle specialist to specializes in ankle replacement surgery to see if she is a candidate for this type of surgery as opposed to potentially an ankle fusion.  She agrees with this treatment plan.  We will see if we can get her referred to a foot and ankle specialist.

## 2022-03-01 ENCOUNTER — Other Ambulatory Visit: Payer: Self-pay

## 2022-03-01 DIAGNOSIS — M25571 Pain in right ankle and joints of right foot: Secondary | ICD-10-CM

## 2022-06-13 IMAGING — US US BREAST*L* LIMITED INC AXILLA
1 series · 11 of 11 positions shown · non-contrast
Comparison: Previous exam(s).

CLINICAL DATA: 45-year-old female with recently diagnosed right
breast DCIS presents with physician palpated lump in the left
breast.

EXAM:
DIGITAL DIAGNOSTIC UNILATERAL LEFT MAMMOGRAM WITH TOMOSYNTHESIS AND
CAD; ULTRASOUND LEFT BREAST LIMITED
TECHNIQUE: Left digital diagnostic mammography and breast tomosynthesis was
performed. The images were evaluated with computer-aided detection.;
Targeted ultrasound examination of the left breast was performed.

[Series 1: us breast*left* limited inc axilla · 0.07mm/px · 11 of 11 slices shown]
[im 1/11]
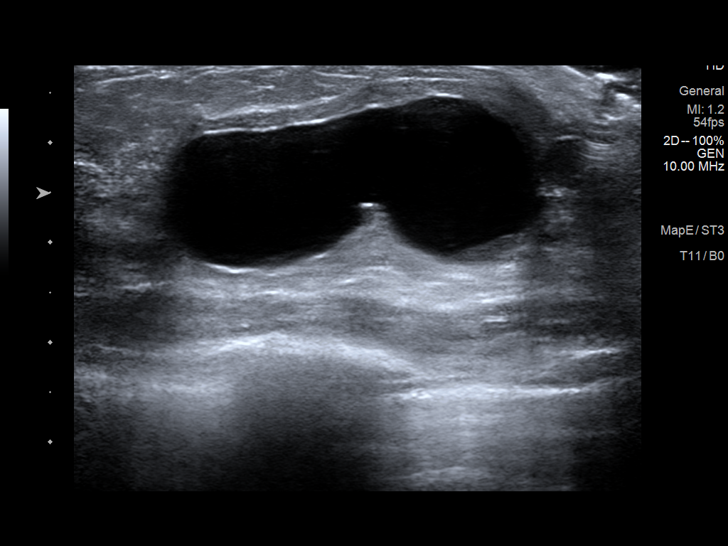
[im 2/11]
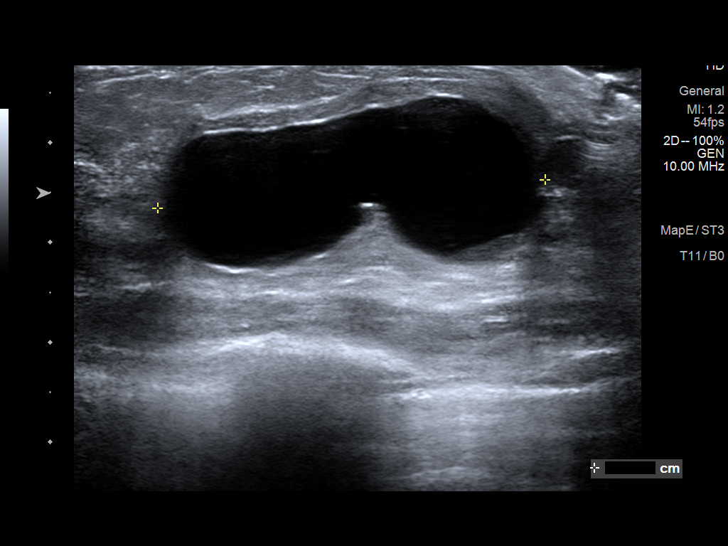
[im 3/11]
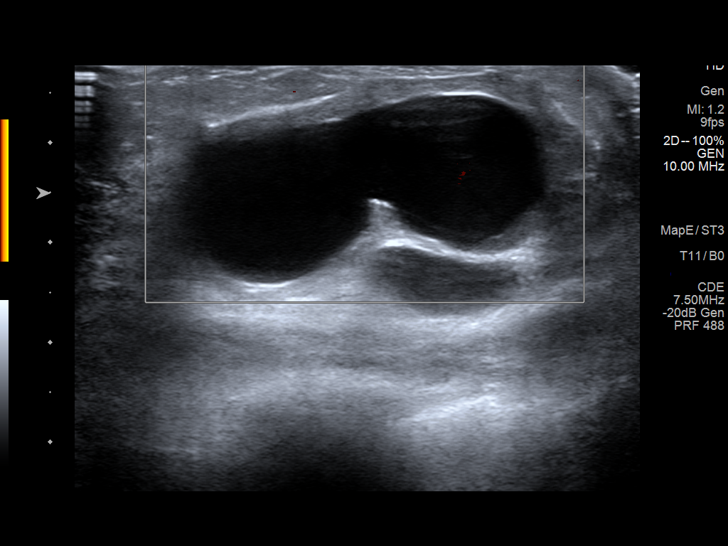
[im 4/11]
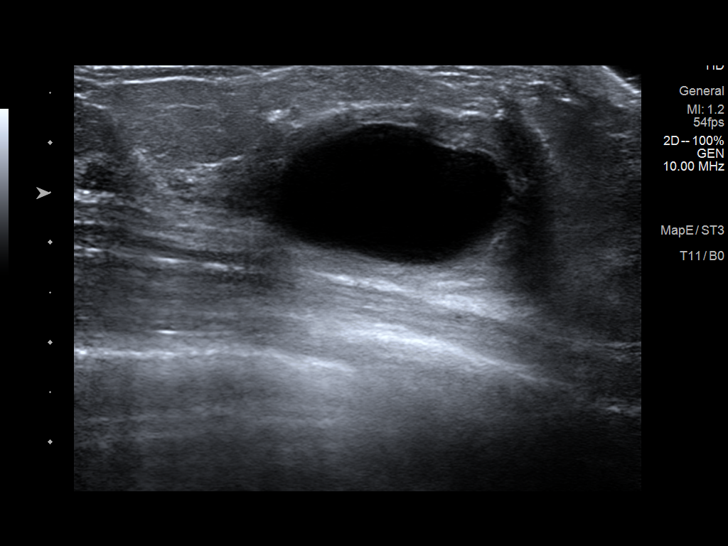
[im 5/11]
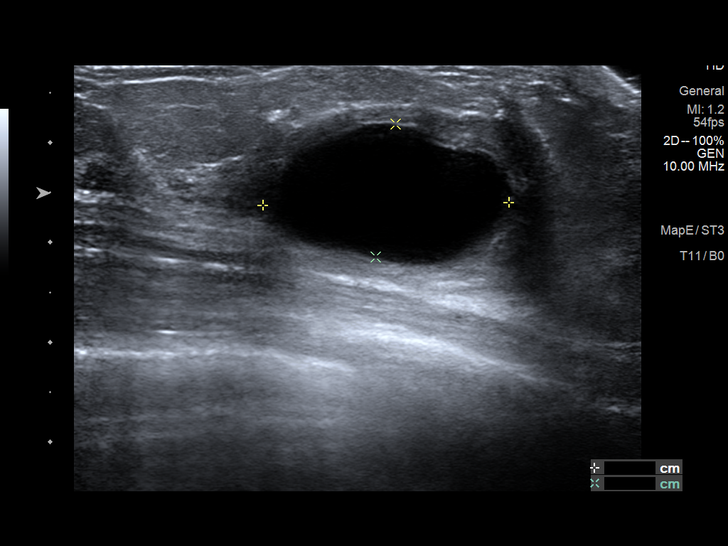
[im 6/11]
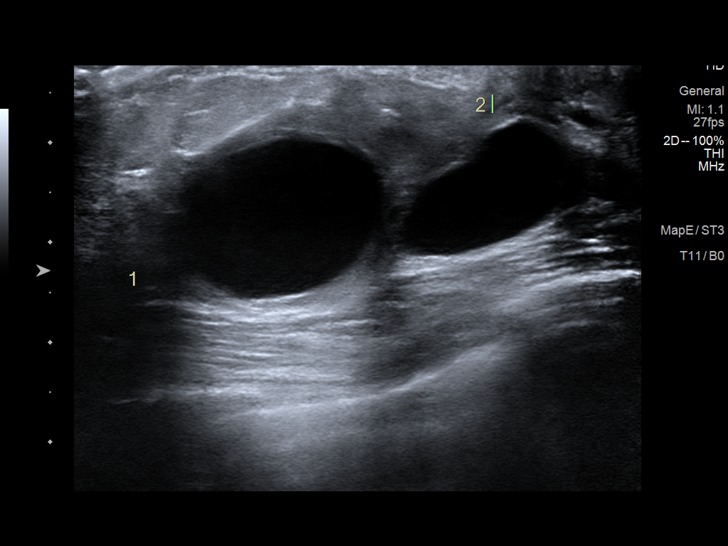
[im 7/11]
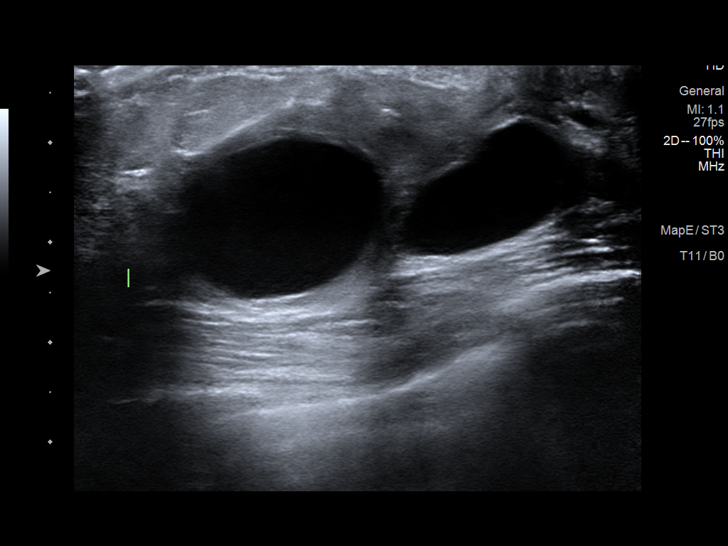
[im 8/11]
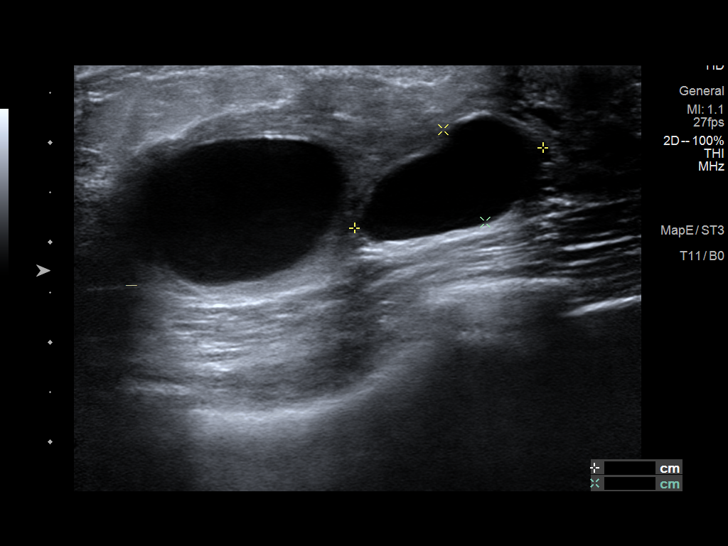
[im 9/11]
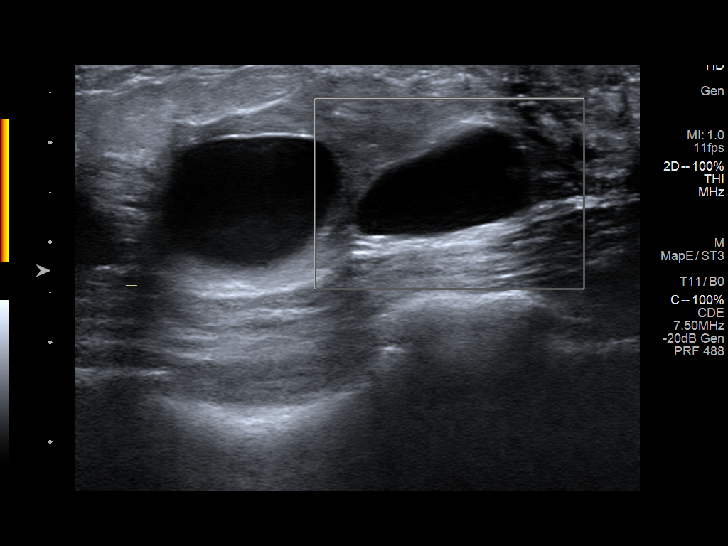
[im 10/11]
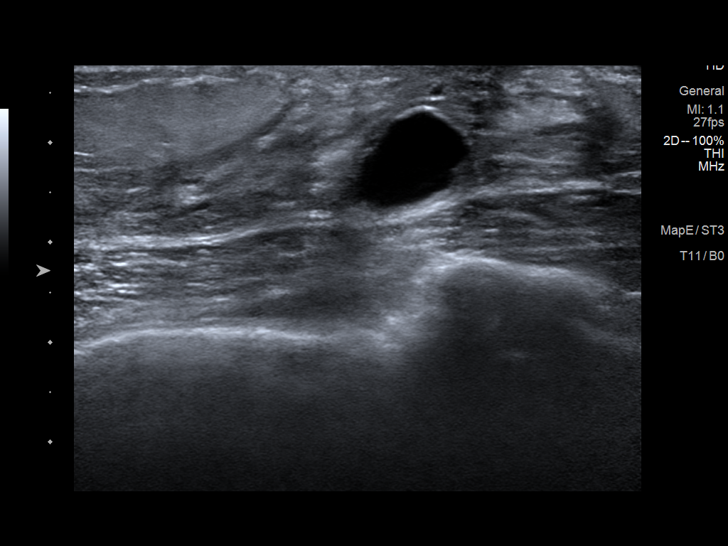
[im 11/11]
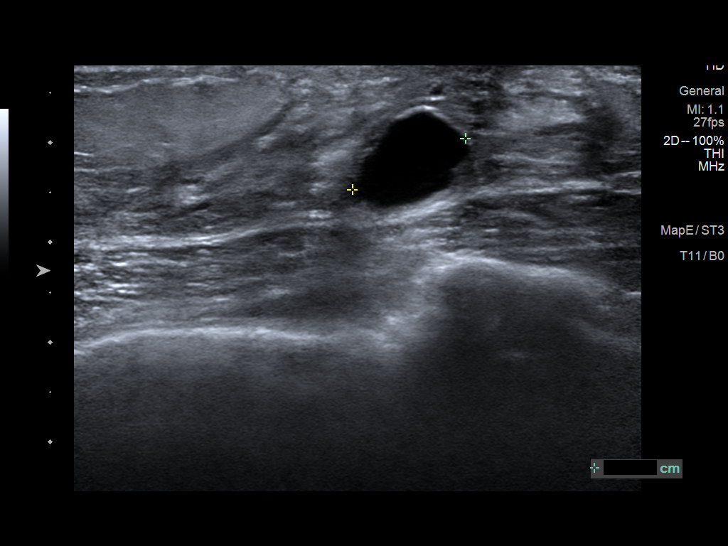

[11 of 11 positions shown; findings below may reference images not displayed]

ACR Breast Density Category d: The breast tissue is extremely dense,
which lowers the sensitivity of mammography.
FINDINGS: Waxing and waning circumscribed equal density masses are
demonstrated throughout the left breast, consistent with a changing
cystic pattern. Otherwise no new or suspicious findings identified.

Targeted ultrasound is performed, showing oval, circumscribed
anechoic masses consistent with benign simple cysts along the 1
o'clock retroareolar position. The largest measures 3.9 x 2.5 x
cm.
IMPRESSION: Benign left breast simple cyst corresponding with the physician
palpated lump. No suspicious findings identified.

RECOMMENDATION:
Routine bilateral mammography due in January 2022.

I have discussed the findings and recommendations with the patient.
If applicable, a reminder letter will be sent to the patient
regarding the next appointment.

BI-RADS CATEGORY  2: Benign.

## 2022-06-13 IMAGING — MG MM DIGITAL DIAGNOSTIC UNILAT*L* W/ TOMO W/ CAD
4 series · 4 of 12 positions shown · non-contrast
Comparison: Previous exam(s).

CLINICAL DATA: 45-year-old female with recently diagnosed right
breast DCIS presents with physician palpated lump in the left
breast.

EXAM:
DIGITAL DIAGNOSTIC UNILATERAL LEFT MAMMOGRAM WITH TOMOSYNTHESIS AND
CAD; ULTRASOUND LEFT BREAST LIMITED
TECHNIQUE: Left digital diagnostic mammography and breast tomosynthesis was
performed. The images were evaluated with computer-aided detection.;
Targeted ultrasound examination of the left breast was performed.

[L CC synth-2D]
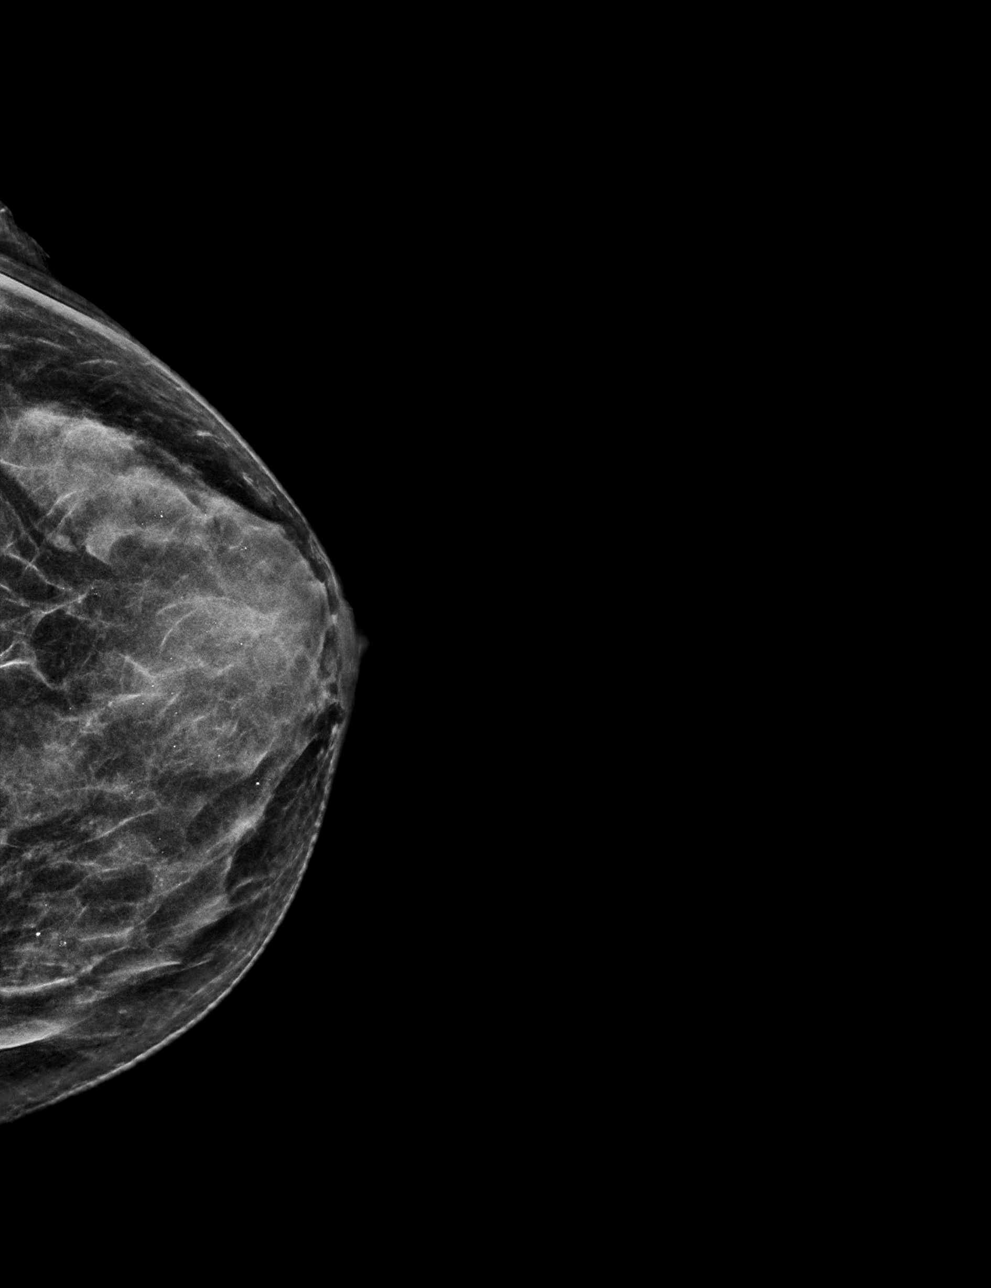

[L MLO synth-2D]
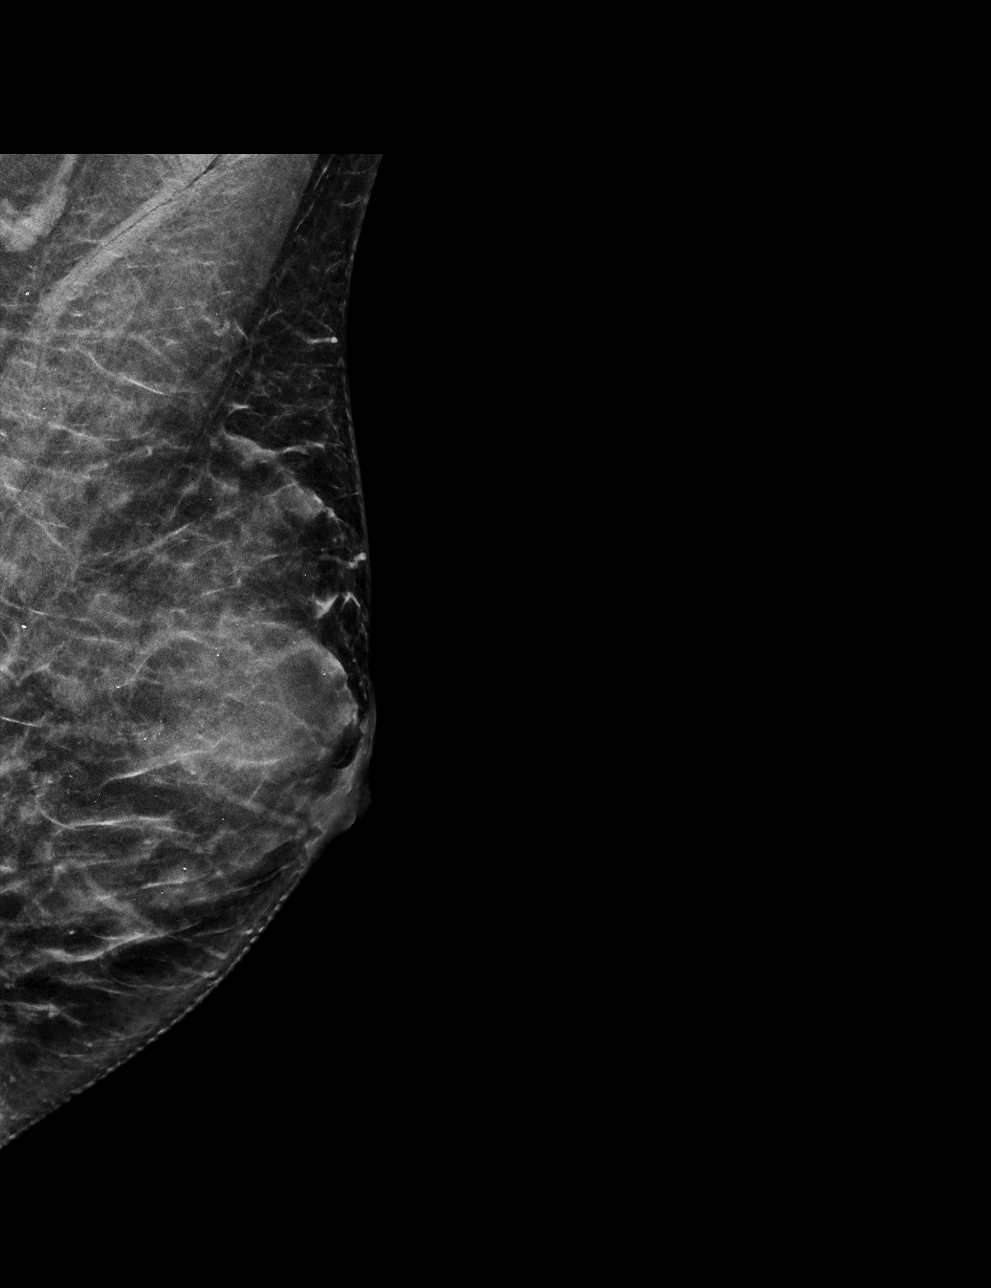

[L CC tomo · tomo slice 23/46.0]
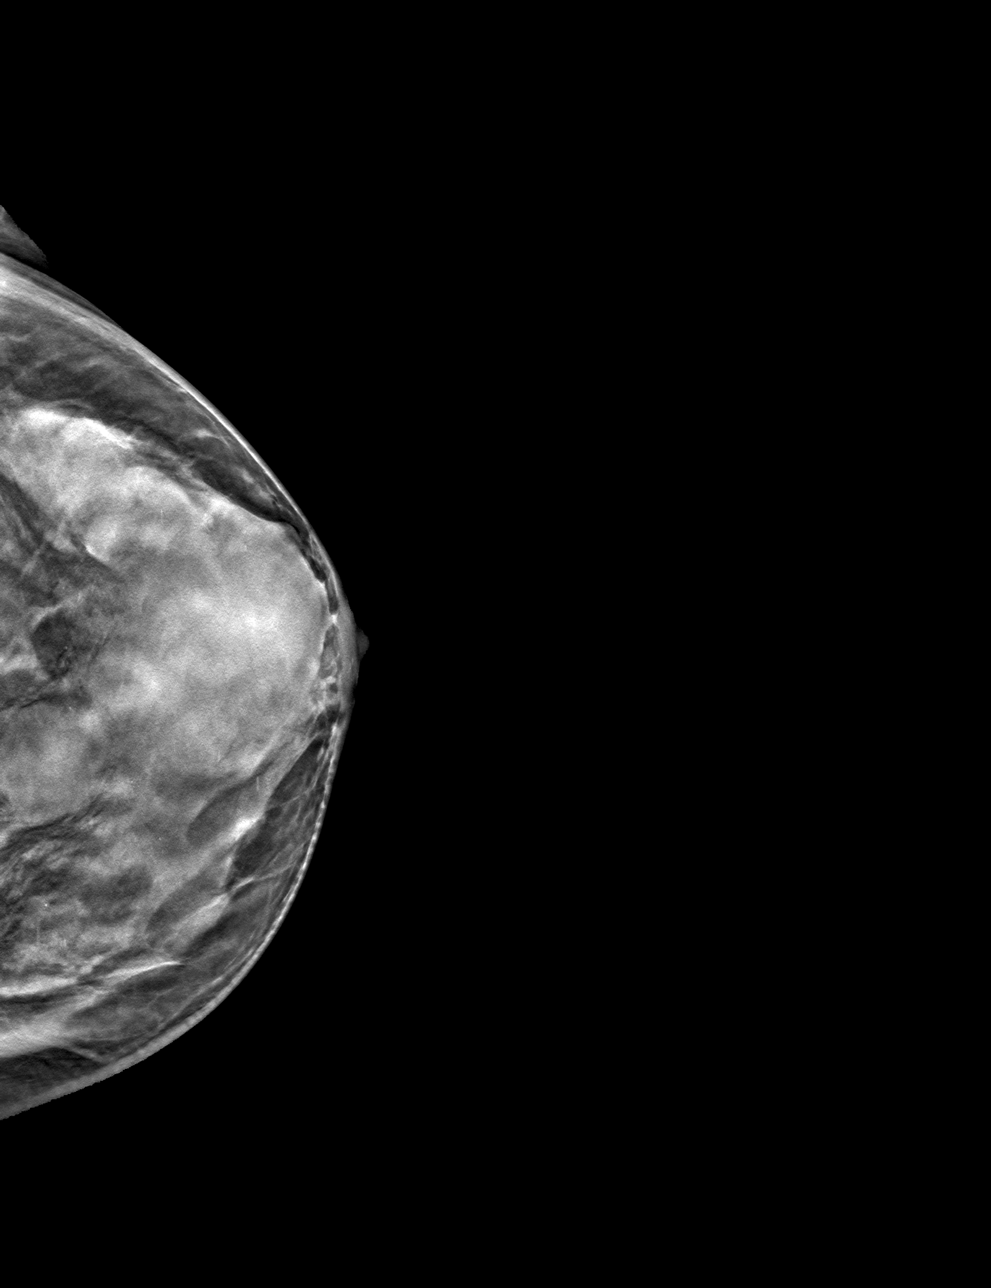

[L MLO tomo · tomo slice 26/51.0]
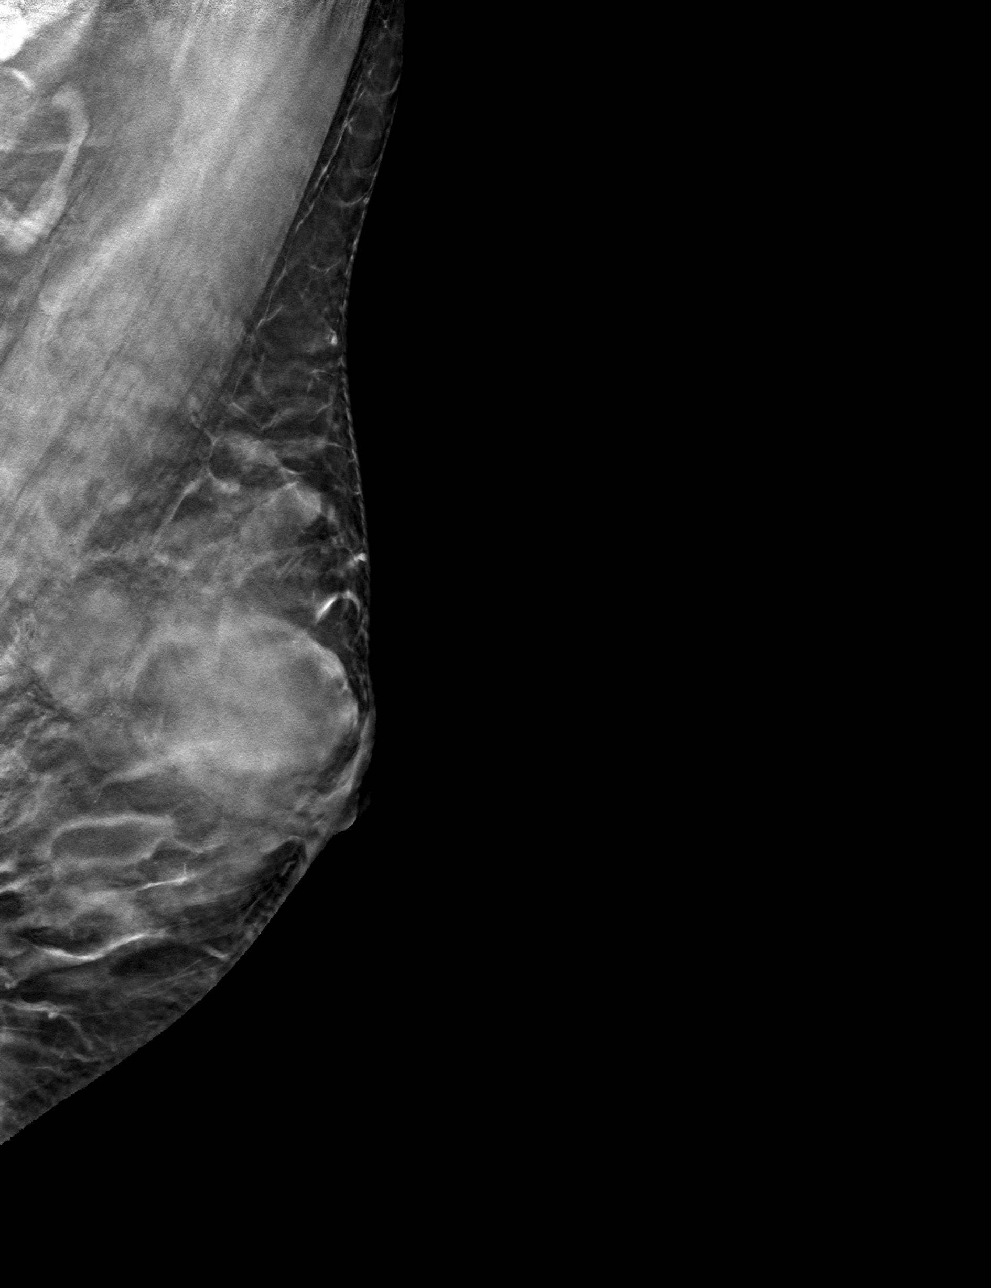

[4 of 12 positions shown; findings below may reference images not displayed]

ACR Breast Density Category d: The breast tissue is extremely dense,
which lowers the sensitivity of mammography.
FINDINGS: Waxing and waning circumscribed equal density masses are
demonstrated throughout the left breast, consistent with a changing
cystic pattern. Otherwise no new or suspicious findings identified.

Targeted ultrasound is performed, showing oval, circumscribed
anechoic masses consistent with benign simple cysts along the 1
o'clock retroareolar position. The largest measures 3.9 x 2.5 x
cm.
IMPRESSION: Benign left breast simple cyst corresponding with the physician
palpated lump. No suspicious findings identified.

RECOMMENDATION:
Routine bilateral mammography due in January 2022.

I have discussed the findings and recommendations with the patient.
If applicable, a reminder letter will be sent to the patient
regarding the next appointment.

BI-RADS CATEGORY  2: Benign.

## 2022-07-08 ENCOUNTER — Ambulatory Visit (HOSPITAL_COMMUNITY)
Admission: RE | Admit: 2022-07-08 | Discharge: 2022-07-08 | Disposition: A | Discharge status: Home / Self Care | Payer: Medicaid Other | Source: Ambulatory Visit | Attending: Adult Health | Admitting: Adult Health

## 2022-07-08 DIAGNOSIS — D0511 Intraductal carcinoma in situ of right breast: Secondary | ICD-10-CM | POA: Diagnosis not present

## 2022-07-08 DIAGNOSIS — R9230 Dense breasts, unspecified: Secondary | ICD-10-CM | POA: Diagnosis present

## 2022-07-08 MED ORDER — GADOBUTROL 1 MMOL/ML IV SOLN
7.0000 mL | Freq: Once | INTRAVENOUS | Status: AC | PRN
  Administered 2022-07-08: 7 mL via INTRAVENOUS

## 2022-07-11 ENCOUNTER — Telehealth: Payer: Self-pay

## 2022-07-11 NOTE — Telephone Encounter (Signed)
Per NP, called pt to give results. She verbalized thanks and understanding.

## 2022-07-11 NOTE — Telephone Encounter (Signed)
-----   Message from Loa Socks, NP sent at 07/11/2022 10:48 AM EST ----- Breast MRI is negative for cancer please let her know. ----- Message ----- From: Interface, Rad Results In Sent: 07/11/2022   9:56 AM EST To: Loa Socks, NP

## 2022-08-10 ENCOUNTER — Other Ambulatory Visit: Payer: Self-pay | Admitting: Hematology and Oncology

## 2022-12-01 ENCOUNTER — Other Ambulatory Visit: Payer: Self-pay | Admitting: Adult Health

## 2022-12-01 DIAGNOSIS — Z853 Personal history of malignant neoplasm of breast: Secondary | ICD-10-CM

## 2023-01-26 ENCOUNTER — Ambulatory Visit
Admission: RE | Admit: 2023-01-26 | Discharge: 2023-01-26 | Disposition: A | Discharge status: Home / Self Care | Payer: Medicaid Other | Source: Ambulatory Visit | Attending: Adult Health | Admitting: Adult Health

## 2023-01-26 DIAGNOSIS — Z853 Personal history of malignant neoplasm of breast: Secondary | ICD-10-CM

## 2023-01-26 HISTORY — DX: Personal history of irradiation: Z92.3

## 2023-02-23 ENCOUNTER — Inpatient Hospital Stay: Payer: Medicaid Other | Attending: Hematology and Oncology | Admitting: Hematology and Oncology

## 2023-02-23 NOTE — Assessment & Plan Note (Deleted)
01/15/2021:Palpable lump in right breast. Diagnostic mammogram and [place] on 01/12/21 showed intermediate right breast calcifications, benign simple cysts in bilateral breast, one of which corresponds with her palpable right breast lump, and no evidence of malignancy bilaterally. Biopsy of the right breast on 01/15/21 showed DCIS with calcification and necrosis, ER+/PR+ (>95%).   Recommendation: 1. Breast conserving surgery: 03/02/21: DCIS IG , Margins Neg 2. Followed by adjuvant radiation therapy 04/15/2021 -06/01/2021  3. Followed by antiestrogen therapy with tamoxifen 5 years.  07/04/2021 Weight loss: Possibly related to gastritis: I instructed her to take omeprazole or Nexium over-the-counter.   Tamoxifen toxicities: Denies any adverse effects to tamoxifen.   Breast cancer surveillance: 1.  Mammogram 01/26/2023: Benign breast density category C  2. breast exam 02/23/2023: Benign 3.  Breast MRI 07/11/2022: Benign breast density category D   Return to clinic in 1 year for follow-up

## 2023-03-31 ENCOUNTER — Encounter (HOSPITAL_COMMUNITY): Payer: Self-pay

## 2023-03-31 ENCOUNTER — Emergency Department (HOSPITAL_COMMUNITY)
Admission: EM | Admit: 2023-03-31 | Discharge: 2023-03-31 | Disposition: A | Discharge status: Home / Self Care | Payer: Medicaid Other | Attending: Emergency Medicine | Admitting: Emergency Medicine

## 2023-03-31 ENCOUNTER — Other Ambulatory Visit: Payer: Self-pay

## 2023-03-31 DIAGNOSIS — S61211A Laceration without foreign body of left index finger without damage to nail, initial encounter: Secondary | ICD-10-CM | POA: Diagnosis not present

## 2023-03-31 DIAGNOSIS — S6992XA Unspecified injury of left wrist, hand and finger(s), initial encounter: Secondary | ICD-10-CM | POA: Diagnosis present

## 2023-03-31 DIAGNOSIS — W260XXA Contact with knife, initial encounter: Secondary | ICD-10-CM | POA: Diagnosis not present

## 2023-03-31 DIAGNOSIS — S61311A Laceration without foreign body of left index finger with damage to nail, initial encounter: Secondary | ICD-10-CM

## 2023-03-31 DIAGNOSIS — Y990 Civilian activity done for income or pay: Secondary | ICD-10-CM | POA: Insufficient documentation

## 2023-03-31 DIAGNOSIS — S61213A Laceration without foreign body of left middle finger without damage to nail, initial encounter: Secondary | ICD-10-CM | POA: Insufficient documentation

## 2023-03-31 MED ORDER — BACITRACIN ZINC 500 UNIT/GM EX OINT
TOPICAL_OINTMENT | Freq: Every day | CUTANEOUS | Status: DC
  Administered 2023-03-31: 1 via TOPICAL
  Filled 2023-03-31: qty 0.9

## 2023-03-31 MED ORDER — IBUPROFEN 800 MG PO TABS
800.0000 mg | ORAL_TABLET | Freq: Once | ORAL | Status: AC
  Administered 2023-03-31: 800 mg via ORAL
  Filled 2023-03-31: qty 1

## 2023-03-31 MED ORDER — LIDOCAINE HCL (PF) 1 % IJ SOLN
10.0000 mL | Freq: Once | INTRAMUSCULAR | Status: AC
  Administered 2023-03-31: 10 mL
  Filled 2023-03-31: qty 30

## 2023-03-31 NOTE — Discharge Instructions (Addendum)
For your sutures: You must get your sutures removed in 10-14 days. We recommend visiting your PCP or an urgent care for suture removal. However, you may also return back to the ER if you are unable to be seen by your PCP or at urgent care.   You may gently clean the area around your sutures as needed with soap and water. Place antibiotic ointment such as bacitracin or neosporin over your laceration after cleaning the area.  Keep the laceration covered with sterile gauze as shown here if you are doing an activity in which it may get dirty. You may pick these supplies up at any drugstore.  Do not submerge your laceration in water (no baths, swimming) until it is fully healed. You may shower.   For your skin glue:  Do not scratch, rub, or pick at the adhesive. Leave tissue adhesive in place. It will come off naturally after 7-10 days. Do not place tape over the adhesive. The adhesive could come off the wound when you pull the tape off. Protect the wound from further injury until it is healed. Check your wound area every day for signs of infection. Check for: More redness, swelling, or pain,Fluid or blood,Warmth, Pus or a bad smell. Do not take baths, swim, or use a hot tub until your health care provider approves. You may only be allowed to take sponge baths. Ask your health care provider if you may take showers.You can usually shower after the first 24 hours. Cover the dressing with a watertight covering when you take a shower. Do not soak the area where there is tissue adhesive. Do not use any soaps, petroleum jelly products, or ointments on the wound. Certain ointments can weaken the adhesive.   You may use up to 800mg  ibuprofen every 6 hours as needed for pain.   Return to the ER should you develop fever, chills, pus drainage from your wound, redness around your wound.

## 2023-03-31 NOTE — ED Provider Notes (Signed)
[place] EMERGENCY DEPARTMENT AT [facility] Provider Note   CSN: [id number] Arrival date & time: 03/31/23  1155     History  Chief Complaint  Patient presents with   finger laceration    Brittney Price is a 47 y.o. female who presents with concern for laceration that occurred approximately 1 hour prior to arrival.  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  Bleeding was well-controlled upon arrival.  She is up-to-date on her tetanus.  Denies any loss of sensation in her fingertips or difficulty moving her fingertips.  HPI     Home Medications Prior to Admission medications   Medication Sig Start Date End Date Taking? Authorizing Provider  acetaminophen (TYLENOL) 500 MG tablet Take 500 mg by mouth every 6 (six) hours as needed for moderate pain.    [provider]  gabapentin (NEURONTIN) 300 MG capsule Take 300 mg by mouth daily. 07/22/21   [provider]  hydrOXYzine (ATARAX) 10 MG tablet Take by mouth. 11/25/21   [provider]  ibuprofen (ADVIL) 200 MG tablet Take 400-600 mg by mouth every 6 (six) hours as needed for moderate pain. Patient not taking: Reported on 08/24/2021    [provider]  Multiple Vitamins-Minerals (MULTIVITAMIN WITH MINERALS) tablet Take 1 tablet by mouth daily.    [provider]  tamoxifen (NOLVADEX) 20 MG tablet TAKE 1 TABLET(20 MG) BY MOUTH DAILY 08/11/22   Serena Croissant, MD  fluticasone (FLONASE) 50 MCG/ACT nasal spray Place 1 spray into both nostrils daily. Patient not taking: No sig reported 07/10/17 10/29/20  Petrucelli, Pleas Koch, PA-C      Allergies    Patient has no known allergies.    Review of Systems   Review of Systems  Skin:  Positive for wound.    Physical Exam Updated Vital Signs BP 126/72   Pulse 61   Temp 98.4 F (36.9 C) (Oral)   Resp 16   Ht 5\' 6"  (1.676 m)   Wt 65.8 kg   SpO2 100%   BMI 23.40 kg/m  Physical Exam Vitals  and nursing note reviewed.  Constitutional:      Appearance: Normal appearance.  HENT:     Head: Atraumatic.  Pulmonary:     Effort: Pulmonary effort is normal.  Musculoskeletal:     Comments: Approximately 3 cm laceration to the left distal third fingertip extending through the nailbed, bleeding well controlled  Superficial 2cm laceration to the left index fingertip involving the nailbed, bleeding well controlled  Sensation intact in the left index and middle fingers  Able to flex and extend at the left 3rd MTP, DIP, Pip   Neurological:     General: No focal deficit present.     Mental Status: She is alert.  Psychiatric:        Mood and Affect: Mood normal.        Behavior: Behavior normal.       ED Results / Procedures / Treatments   Labs (all labs ordered are listed, but only abnormal results are displayed) Labs Reviewed - No data to display  EKG None  Radiology No results found.  Procedures .Marland KitchenLaceration Repair  Date/Time: 03/31/2023 1:23 PM  Performed by: Arabella Merles, PA-C Authorized by: Arabella Merles, PA-C   Consent:    Consent obtained:  Verbal   Consent given by:  Patient   Risks, benefits, and alternatives were discussed: yes  Risks discussed:  Infection, pain and poor cosmetic result   Alternatives discussed:  No treatment Universal protocol:    Procedure explained and questions answered to patient or proxy's satisfaction: yes     Patient identity confirmed:  Verbally with patient Anesthesia:    Anesthesia method:  Local infiltration   Local anesthetic:  Lidocaine 1% w/o epi Laceration details:    Location:  Finger   Finger location:  L long finger   Length (cm):  3 Pre-procedure details:    Preparation:  Patient was prepped and draped in usual sterile fashion Treatment:    Area cleansed with:  Povidone-iodine   Irrigation solution:  Tap water   Debridement:  None Skin repair:    Repair method:  Sutures   Suture size:  4-0    Wound skin closure material used: Ethilon.   Suture technique:  Simple interrupted   Number of sutures:  4 Repair type:    Repair type:  Simple Post-procedure details:    Dressing:  Non-adherent dressing and antibiotic ointment   Procedure completion:  Tolerated well, no immediate complications .Marland KitchenLaceration Repair  Date/Time: 03/31/2023 1:26 PM  Performed by: Arabella Merles, PA-C Authorized by: Arabella Merles, PA-C   Consent:    Consent obtained:  Verbal   Consent given by:  Patient   Risks discussed:  Infection, pain and poor cosmetic result Universal protocol:    Procedure explained and questions answered to patient or proxy's satisfaction: yes     Patient identity confirmed:  Verbally with patient Anesthesia:    Anesthesia method:  None Laceration details:    Location:  Finger   Finger location:  L index finger   Length (cm):  2 Treatment:    Area cleansed with:  Povidone-iodine   Irrigation solution:  Tap water   Debridement:  None Skin repair:    Repair method: DermaBond. Repair type:    Repair type:  Simple Post-procedure details:    Procedure completion:  Tolerated well, no immediate complications     Medications Ordered in ED Medications  bacitracin ointment (1 Application Topical Given 03/31/23 1326)  lidocaine (PF) (XYLOCAINE) 1 % injection 10 mL (10 mLs Infiltration Given by Other 03/31/23 1229)  ibuprofen (ADVIL) tablet 800 mg (800 mg Oral Given 03/31/23 1321)    ED Course/ Medical Decision Making/ A&P                                 Medical Decision Making Risk Prescription drug management.   47 y.o. female presents to the ED for concern of left fingertip lacerations  Differential diagnosis includes but is not limited to fingertip laceration, foreign body, tendon injury  ED Course:  Patient presents approximately 1 after sustaining a laceration to her left index and middle finger tip from cutting bacon at work.  Bleeding was well-controlled  upon arrival.  She denies any loss of sensation in her fingertips.  Full range of motion.  Her tetanus is up-to-date.  The left middle finger laceration was closed with 4 Ethilon sutures without any complications and dressed prior to discharge.  The left index finger laceration was superficial and Dermabond was placed over this.  She was instructed on appropriate care for the lacerations.  Ibuprofen given prior to discharge for pain.     Impression: Left middle finger laceration over the distal phalanx involving the nailbed Left index finger laceration over the distal phalanx involving the nailbed, superficial  Disposition:  The patient was discharged home with instructions to follow-up with her PCP in 10 to 14 days for suture removal.  She was instructed on keeping this area clean and covered.  She was shown how to bandage it here in the ER. She was instructed on allowing derma bond to remain in place until it falls off on its own.  Return precautions given.           Final Clinical Impression(s) / ED Diagnoses Final diagnoses:  Laceration of left middle finger without foreign body, nail damage status unspecified, initial encounter  Laceration of left index finger without foreign body with damage to nail, initial encounter    Rx / DC Orders ED Discharge Orders     None         Arabella Merles, PA-C 03/31/23 1328    Bethann Berkshire, MD 04/02/23 [phone]

## 2023-03-31 NOTE — ED Triage Notes (Signed)
Cut second and third fingertip on left hand with knife at work. Tetanus is up to date. Bleeding is controlled.

## 2023-08-22 ENCOUNTER — Other Ambulatory Visit: Payer: Self-pay | Admitting: Hematology and Oncology

## 2023-08-30 ENCOUNTER — Telehealth: Payer: Self-pay | Admitting: Hematology and Oncology

## 2023-08-30 NOTE — Telephone Encounter (Signed)
Scheduled appointment per 1/14 scheduling message. Patient will be made aware of the appointment scheduled once receiving  appointment reminder in the mail. Patient is active on MyChart.

## 2023-10-02 ENCOUNTER — Inpatient Hospital Stay: Payer: Medicaid Other | Attending: Hematology and Oncology | Admitting: Hematology and Oncology

## 2023-10-02 ENCOUNTER — Other Ambulatory Visit: Payer: Self-pay

## 2023-10-02 VITALS — BP 120/67 | HR 78 | Temp 98.8°F | Resp 17 | Ht 66.0 in | Wt 148.5 lb

## 2023-10-02 DIAGNOSIS — Z79899 Other long term (current) drug therapy: Secondary | ICD-10-CM | POA: Diagnosis not present

## 2023-10-02 DIAGNOSIS — D0511 Intraductal carcinoma in situ of right breast: Secondary | ICD-10-CM | POA: Diagnosis not present

## 2023-10-02 MED ORDER — TAMOXIFEN CITRATE 20 MG PO TABS: 20.0000 mg | ORAL_TABLET | Freq: Every day | ORAL | 3 refills | Status: AC

## 2023-10-02 NOTE — Progress Notes (Signed)
 Patient Care Team: Modesta Messing as PCP - General (Physician Assistant) Manus Rudd, MD as Consulting Physician (General Surgery) Serena Croissant, MD as Consulting Physician (Hematology and Oncology) Dorothy Puffer, MD as Consulting Physician (Radiation Oncology)  DIAGNOSIS:  Encounter Diagnosis  Name Primary?   Ductal carcinoma in situ (DCIS) of right breast Yes    SUMMARY OF ONCOLOGIC HISTORY: Oncology History  Ductal carcinoma in situ (DCIS) of right breast  01/15/2021 Initial Diagnosis   Palpable lump in right breast. Diagnostic mammogram and [place] on 01/12/21 showed intermediate right breast calcifications, benign simple cysts in bilateral breast, one of which corresponds with her palpable right breast lump, and no evidence of malignancy bilaterally. Biopsy of the right breast on 01/15/21 showed DCIS with calcification and necrosis, ER+/PR+ (>95%).   01/27/2021 Cancer Staging   Staging form: Breast, AJCC 8th Edition - Clinical stage from 01/27/2021: Stage 0 (cTis (DCIS), cN0, cM0, ER+, PR+) - Signed by Serena Croissant, MD on 01/27/2021 Stage prefix: Initial diagnosis Nuclear grade: G2   02/03/2021 Genetic Testing   No pathogenic variants detected in [facility] BRCAPlus Panel or [facility] CancerNext-Expanded +RNAinsight Panel.  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  The report dates are February 03, 2021 and February 09, 2021, respectively.    The BRCAplus panel offered by [employer] and includes sequencing and deletion/duplication analysis for the following 8 genes: ATM, BRCA1, BRCA2, CDH1, CHEK2, PALB2, PTEN, and TP53.  The CancerNext-Expanded gene panel offered by [facility] and includes sequencing, rearrangement, and RNA analysis for the following 77 genes: AIP, ALK, APC, ATM, AXIN2, BAP1, BARD1, BLM, BMPR1A, BRCA1, BRCA2, BRIP1, CDC73, CDH1, CDK4, CDKN1B, CDKN2A, CHEK2, CTNNA1, DICER1, FANCC, FH, FLCN, GALNT12, KIF1B, LZTR1, MAX, MEN1, MET, MLH1,  MSH2, MSH3, MSH6, MUTYH, NBN, NF1, NF2, NTHL1, PALB2, PHOX2B, PMS2, POT1, PRKAR1A, PTCH1, PTEN, RAD51C, RAD51D, RB1, RECQL, RET, SDHA, SDHAF2, SDHB, SDHC, SDHD, SMAD4, SMARCA4, SMARCB1, SMARCE1, STK11, SUFU, TMEM127, TP53, TSC1, TSC2, VHL and XRCC2 (sequencing and deletion/duplication); EGFR, EGLN1, HOXB13, KIT, MITF, PDGFRA, POLD1, and POLE (sequencing only); EPCAM and GREM1 (deletion/duplication only).    03/02/2021 Surgery   Right lumpectomy: intermediate grade DCIS with margins uninvolved by carcinoma, ER greater than 95%, PR greater than 95%   03/02/2021 Cancer Staging   Staging form: Breast, AJCC 8th Edition - Pathologic stage from 03/02/2021: Stage 0 (pTis (DCIS), pN0, cM0, ER+, PR+) - Signed by Loa Socks, NP on 08/13/2021 Stage prefix: Initial diagnosis Nuclear grade: G2   04/15/2021 - 06/01/2021 Radiation Therapy   Adjuvant radiation therapy   07/04/2021 -  Anti-estrogen oral therapy   Tamoxifen     CHIEF COMPLIANT: Follow-up on tamoxifen  HISTORY OF PRESENT ILLNESS: Brittney Price is a 48 year old with above-mentioned history of DCIS is currently on tamoxifen therapy since November 2022.  She is tolerating tamoxifen extremely well without any side effects.  She is staying extremely busy working at [employer] daily.    ALLERGIES:  has no known allergies.  MEDICATIONS:  Current Outpatient Medications  Medication Sig Dispense Refill   acetaminophen (TYLENOL) 500 MG tablet Take 500 mg by mouth every 6 (six) hours as needed for moderate pain.     gabapentin (NEURONTIN) 300 MG capsule Take 300 mg by mouth daily.     hydrOXYzine (ATARAX) 10 MG tablet Take by mouth.     ibuprofen (ADVIL) 200 MG tablet Take 400-600 mg by mouth every 6 (six) hours as needed for moderate pain. (Patient not taking: Reported on 08/24/2021)  Multiple Vitamins-Minerals (MULTIVITAMIN WITH MINERALS) tablet Take 1 tablet by mouth daily.     tamoxifen (NOLVADEX) 20 MG tablet Take 1 tablet (20 mg total) by  mouth daily. 90 tablet 3   No current facility-administered medications for this visit.    PHYSICAL EXAMINATION: ECOG PERFORMANCE STATUS: 1 - Symptomatic but completely ambulatory  Vitals:   10/02/23 1506  BP: 120/67  Pulse: 78  Resp: 17  Temp: 98.8 F (37.1 C)  SpO2: 98%   Filed Weights   10/02/23 1506  Weight: 148 lb 8 oz (67.4 kg)    Physical Exam No palpable lumps or nodules of concern in bilateral breasts or axilla  (exam performed in the presence of a chaperone)  LABORATORY DATA:  I have reviewed the data as listed    Latest Ref Rng & Units 08/25/2021    8:09 AM 02/25/2021    9:08 AM 01/27/2021    8:18 AM  CMP  Glucose 70 - 99 mg/dL 161  096  045   BUN 6 - 20 mg/dL 10  11  8    Creatinine 0.44 - 1.00 mg/dL 4.09  8.11  9.14   Sodium 135 - 145 mmol/L 141  139  141   Potassium 3.5 - 5.1 mmol/L 3.9  3.9  4.1   Chloride 98 - 111 mmol/L 110  108  109   CO2 22 - 32 mmol/L 26  27  26    Calcium 8.9 - 10.3 mg/dL 8.7  8.9  8.5   Total Protein 6.5 - 8.1 g/dL 6.0  6.1  5.9   Total Bilirubin 0.3 - 1.2 mg/dL 0.4  0.7  0.3   Alkaline Phos 38 - 126 U/L 40  47  46   AST 15 - 41 U/L 11  16  12    ALT 0 - 44 U/L 12  23  15      Lab Results  Component Value Date   WBC 8.8 08/25/2021   HGB 11.7 (L) 08/25/2021   HCT 34.4 (L) 08/25/2021   MCV 93.5 08/25/2021   PLT 115 (L) 08/25/2021   NEUTROABS 6.4 08/25/2021    ASSESSMENT & PLAN:  Ductal carcinoma in situ (DCIS) of right breast 01/15/2021:Palpable lump in right breast. Diagnostic mammogram and [place] on 01/12/21 showed intermediate right breast calcifications, benign simple cysts in bilateral breast, one of which corresponds with her palpable right breast lump, and no evidence of malignancy bilaterally. Biopsy of the right breast on 01/15/21 showed DCIS with calcification and necrosis, ER+/PR+ (>95%).   Recommendation: 1. Breast conserving surgery: 03/02/21: DCIS IG , Margins Neg 2. Followed by adjuvant radiation therapy 04/15/2021  -06/01/2021  3. Followed by antiestrogen therapy with tamoxifen 5 years.  07/04/2021 Weight loss: Possibly related to gastritis: I instructed her to take omeprazole or Nexium over-the-counter.   Tamoxifen toxicities: Denies any adverse effects to tamoxifen.   Breast cancer surveillance: 1.  Mammogram: 01/26/2023: Benign breast density category C 2. breast exam 10/02/2023: Benign   Return to clinic in 1 year for follow-up       Orders Placed This Encounter  Procedures   MM DIAG BREAST TOMO BILATERAL    Standing Status:   Future    Expected Date:   01/29/2024    Expiration Date:   10/01/2024    Reason for Exam (SYMPTOM  OR DIAGNOSIS REQUIRED):   Annual mammograms with H/O DCIS    Is the patient pregnant?:   No    Preferred imaging location?:   [facility]  Release to patient:   Immediate   The patient has a good understanding of the overall plan. she agrees with it. she will call with any problems that may develop before the next visit here. Total time spent: 30 mins including face to face time and time spent for planning, charting and co-ordination of care   Tamsen Meek, MD 10/02/23

## 2023-10-02 NOTE — Assessment & Plan Note (Signed)
 01/15/2021:Palpable lump in right breast. Diagnostic mammogram and [place] on 01/12/21 showed intermediate right breast calcifications, benign simple cysts in bilateral breast, one of which corresponds with her palpable right breast lump, and no evidence of malignancy bilaterally. Biopsy of the right breast on 01/15/21 showed DCIS with calcification and necrosis, ER+/PR+ (>95%).   Recommendation: 1. Breast conserving surgery: 03/02/21: DCIS IG , Margins Neg 2. Followed by adjuvant radiation therapy 04/15/2021 -06/01/2021  3. Followed by antiestrogen therapy with tamoxifen 5 years.  07/04/2021 Weight loss: Possibly related to gastritis: I instructed her to take omeprazole or Nexium over-the-counter.   Tamoxifen toxicities: Denies any adverse effects to tamoxifen.   Breast cancer surveillance: 1.  Mammogram: 01/26/2023: Benign breast density category C 2. breast exam 10/02/2023: Benign   Return to clinic in 1 year for follow-up

## 2023-10-03 ENCOUNTER — Telehealth: Payer: Self-pay | Admitting: Hematology and Oncology

## 2023-10-03 NOTE — Telephone Encounter (Signed)
 Scheduled appointment per 2/24 los. Left VM with appointment details.

## 2024-01-30 ENCOUNTER — Ambulatory Visit
Admission: RE | Admit: 2024-01-30 | Discharge: 2024-01-30 | Disposition: A | Discharge status: Home / Self Care | Source: Ambulatory Visit | Attending: Hematology and Oncology

## 2024-01-30 DIAGNOSIS — D0511 Intraductal carcinoma in situ of right breast: Secondary | ICD-10-CM

## 2024-02-13 NOTE — Progress Notes (Signed)
   EGD completed. Please see endoscopy note in the media or procedures tab dated 02/13/2024.  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  Minimal gastritis.  Biopsies obtained throughout. Murat K Akdamar, MD 02/13/2024 / 1:48 PM

## 2024-03-27 ENCOUNTER — Encounter (HOSPITAL_COMMUNITY): Payer: Self-pay

## 2024-03-27 ENCOUNTER — Emergency Department (HOSPITAL_COMMUNITY)
Admission: EM | Admit: 2024-03-27 | Discharge: 2024-03-28 | Disposition: A | Discharge status: Home / Self Care | Attending: Emergency Medicine | Admitting: Emergency Medicine

## 2024-03-27 ENCOUNTER — Other Ambulatory Visit: Payer: Self-pay

## 2024-03-27 DIAGNOSIS — N83202 Unspecified ovarian cyst, left side: Secondary | ICD-10-CM | POA: Insufficient documentation

## 2024-03-27 DIAGNOSIS — D259 Leiomyoma of uterus, unspecified: Secondary | ICD-10-CM | POA: Diagnosis not present

## 2024-03-27 DIAGNOSIS — N939 Abnormal uterine and vaginal bleeding, unspecified: Secondary | ICD-10-CM | POA: Diagnosis present

## 2024-03-27 LAB — COMPREHENSIVE METABOLIC PANEL WITH GFR
ALT: 29 U/L (ref 0–44)
AST: 28 U/L (ref 15–41)
Albumin: 3.9 g/dL (ref 3.5–5.0)
Alkaline Phosphatase: 44 U/L (ref 38–126)
Anion gap: 5 (ref 5–15)
BUN: 8 mg/dL (ref 6–20)
CO2: 26 mmol/L (ref 22–32)
Calcium: 8.8 mg/dL — ABNORMAL LOW (ref 8.9–10.3)
Chloride: 111 mmol/L (ref 98–111)
Creatinine, Ser: 0.67 mg/dL (ref 0.44–1.00)
GFR, Estimated: >60 mL/min (ref >60)
Glucose, Bld: 107 mg/dL — ABNORMAL HIGH (ref 70–99)
Potassium: 3.9 mmol/L (ref 3.5–5.1)
Sodium: 142 mmol/L (ref 135–145)
Total Bilirubin: 0.7 mg/dL (ref 0.0–1.2)
Total Protein: 6.5 g/dL (ref 6.5–8.1)

## 2024-03-27 LAB — URINALYSIS, ROUTINE W REFLEX MICROSCOPIC
Bacteria, UA: NONE SEEN
Bilirubin Urine: NEGATIVE
Glucose, UA: NEGATIVE mg/dL
Ketones, ur: NEGATIVE mg/dL
Leukocytes,Ua: NEGATIVE
Nitrite: NEGATIVE
Protein, ur: NEGATIVE mg/dL
Specific Gravity, Urine: 1.027 (ref 1.005–1.030)
pH: 5 (ref 5.0–8.0)

## 2024-03-27 LAB — CBC
HCT: 36.4 % (ref 36.0–46.0)
Hemoglobin: 11.9 g/dL — ABNORMAL LOW (ref 12.0–15.0)
MCH: 30.7 pg (ref 26.0–34.0)
MCHC: 32.7 g/dL (ref 30.0–36.0)
MCV: 94.1 fL (ref 80.0–100.0)
Platelets: 132 K/uL — ABNORMAL LOW (ref 150–400)
RBC: 3.87 MIL/uL (ref 3.87–5.11)
RDW: 11.8 % (ref 11.5–15.5)
WBC: 5.5 K/uL (ref 4.0–10.5)
nRBC: 0 % (ref 0.0–0.2)

## 2024-03-27 LAB — LIPASE, BLOOD: Lipase: 32 U/L (ref 11–51)

## 2024-03-27 LAB — HCG, SERUM, QUALITATIVE: Preg, Serum: NEGATIVE

## 2024-03-27 NOTE — ED Provider Notes (Signed)
 [facility] Emergency Department Provider Note MRN:  [id number]  Arrival date & time: 03/28/24     Chief Complaint   Vaginal Bleeding   History of Present Illness   Brittney Price is a 48 y.o. year-old female presents to the ED with chief complaint of vaginal bleeding.  States that she hasn't had a period in about a year.  States that she started having heavy bleeding for the past 4 days.  Reports nearly immediately filling a tampon.  States that she is currently wearing a diaper.  States that she had a recent stomach infection and has been on medication (amox, clarithromycin, and lansoprozole) and improving with regard to that.  States that she has never had a period last longer than 3 days.  Denies being anticoagulated.    History provided by patient.   Review of Systems  Pertinent positive and negative review of systems noted in HPI.    Physical Exam   Vitals:   03/28/24 0100 03/28/24 0204  BP: 92/62   Pulse: (!) 58   Resp: 18   Temp:  98 F (36.7 C)  SpO2: 98%     CONSTITUTIONAL:  non toxic-appearing, NAD NEURO:  Alert and oriented x 3, CN 3-12 grossly intact EYES:  eyes equal and reactive ENT/NECK:  Supple, no stridor  CARDIO:  normal rate, regular rhythm, appears well-perfused  PULM:  No respiratory distress, CTAB GI/GU:  non-distended, no focal abdominal tenderness MSK/SPINE:  No gross deformities, no edema, moves all extremities  SKIN:  no rash, atraumatic   *Additional and/or pertinent findings included in MDM below  Diagnostic and Interventional Summary    EKG Interpretation Date/Time:    Ventricular Rate:    PR Interval:    QRS Duration:    QT Interval:    QTC Calculation:   R Axis:      Text Interpretation:         Labs Reviewed  COMPREHENSIVE METABOLIC PANEL WITH GFR - Abnormal; Notable for the following components:      Result Value   Glucose, Bld 107 (*)    Calcium 8.8 (*)    All other components within normal  limits  CBC - Abnormal; Notable for the following components:   Hemoglobin 11.9 (*)    Platelets 132 (*)    All other components within normal limits  URINALYSIS, ROUTINE W REFLEX MICROSCOPIC - Abnormal; Notable for the following components:   Hgb urine dipstick MODERATE (*)    All other components within normal limits  LIPASE, BLOOD  HCG, SERUM, QUALITATIVE    US  Pelvis Complete  Final Result    US  Transvaginal Non-OB  Final Result    US  Art/Ven Flow Abd Pelv Doppler  Final Result      Medications - No data to display   Procedures  /  Critical Care Procedures  ED Course and Medical Decision Making  I have reviewed the triage vital signs, the nursing notes, and pertinent available records from the EMR.  Social Determinants Affecting Complexity of Care: Patient has no clinically significant social determinants affecting this chief complaint..   ED Course:    Medical Decision Making Patient here with reported heavy vaginal bleeding for the past 4 days.  HR, BP, and HGB look good.  Patient would like to have US , I think this is reasonable.  Will perform pelvic to ensure no hemorrhage.    No focal abdominal tenderness on exam.   Pelvic exam is notable for a  small amount of blood in the vaginal vault, no hemorrhage.  Ultrasound notable for thickened endometrial tissue, left ovarian cyst, and uterine fibroid.  Will have patient follow-up with OB/GYN.  We discussed in detail the patient's results.  She states that she will follow-up as directed.  Amount and/or Complexity of Data Reviewed Labs: ordered. Radiology: ordered.         Consultants: No consultations were needed in caring for this patient.   Treatment and Plan: I considered admission due to patient's initial presentation, but after considering the examination and diagnostic results, patient will not require admission and can be discharged with outpatient follow-up.    Final Clinical Impressions(s) /  ED Diagnoses     ICD-10-CM   1. Vaginal bleeding  N93.9     2. Cyst of left ovary  N83.202     3. Uterine leiomyoma, unspecified location  D25.9       ED Discharge Orders     None         Discharge Instructions Discussed with and Provided to Patient:     Discharge Instructions      You need to follow-up with an OBGYN.  Please make an appointment.    Your ultrasound showed the following: ### Gynecologic Ultrasound Findings     Here is an explanation of your recent ultrasound findings in simple terms:      **1. Thickened endometrium (12 millimeters) in a postmenopausal woman**      The endometrium is the lining inside the uterus. After menopause, this lining usually becomes very thin. If it is thicker than expected (like 12 millimeters), it can sometimes mean there is an abnormal growth, such as a polyp, overgrowth of cells (hyperplasia), or, rarely, cancer. Most of the time, especially if there is no bleeding, the cause is benign (not cancer), but the risk of cancer increases when the thickness is above 10 millimeters. Because of this, a sample of the lining (called endometrial sampling or biopsy) is recommended to make sure there is no cancer or other serious condition.[1][2][3]      **2. Left ovarian cyst (3.2 centimeters)**      An ovarian cyst is a fluid-filled sac on the ovary. These are very common and usually harmless, especially if they are simple (just fluid inside). Most cysts go away on their own. For cysts of this size in women who have gone through menopause, it is recommended to repeat the ultrasound in 3 to 6 months to make sure it does not grow or change. This advice does not apply to girls who have not started their periods or to women with a higher risk of ovarian cancer (such as those with certain genetic conditions or a strong family history).[4] Most simple cysts do not need surgery and are not cancer.[5][4]      **3. Small uterine fibroids**       Fibroids are non-cancerous growths in the muscle wall of the uterus. They are very common and often do not cause any symptoms. Many women have fibroids and never know it. If fibroids are small and not causing problems like heavy bleeding or pain, no treatment is needed. They often shrink after menopause.[6][7][8][9]      **4. No evidence of ovarian torsion**      Ovarian torsion means the ovary has twisted, which can cut off its blood supply and cause severe pain. Your ultrasound did not show any signs of this problem, which is good news.      **  Summary**      - The thickened uterine lining should be checked with a biopsy to rule out cancer, even if you have no symptoms.      - The ovarian cyst is likely harmless, but a follow-up ultrasound in a few months is recommended.      - Small fibroids are common and usually do not need treatment unless they cause symptoms.      - There is no sign of a twisted ovary, which is reassuring.      If you have any new symptoms, such as vaginal bleeding, pelvic pain, or changes in your health, let your healthcare provider know right away. These findings are common and most are not serious, but checking them carefully helps keep you safe.[6][7][8][9][1][2][3][5][4]      ### References  1. Hysteroscopy for Asymptomatic Postmenopausal Women With Sonographically Thickened Endometrium. Porter ONEIDA Haidee CHRISTELLA Maile JULIANNA, et al. Robin. 2009;62(2):176-8. doi:10.1016/j.maturitas.2008.11.018. 2. Clinicopathological Features of Endometrial Lesions in Asymptomatic Postmenopausal Women With Thickened Endometrium. Mozell JULIANNA Cesario CINDERELLA Cesario CINDERELLA, et al. Menopause ([place] 2022;29(8):952-956. doi:10.1097/GME.0000000000001993. 3. The Relationship Between Endometrial Thickening and Endometrial Lesions in Postmenopausal Women. Yao L, Li C, Cheng J. Archives of Gynecology and Obstetrics. 2022;306(6):2047-2054. doi:10.1007/s00404-022-06734-7. 4. Simple Adnexal Cysts: SRU  Consensus Conference Update on Follow-Up and Reporting. Claryce JONETTA Blanch MD, Suh-Burgmann EJ, et al. Radiology. 2019;293(2):359-371. doi:10.1148/radiol.7980808645. 5. Lesions of the Ovary and Fallopian Tube. Sisodia RC, Del Carmen MG. The [place] Journal of Medicine. 2022;387(8):727-736. doi:10.1056/NEJMra2108956. 6. Uterine Fibroids. Jackquline EA. The [place] Journal of Medicine. 2015;372(17):1646-55. doi:10.1056/NEJMcp1411029. 7. Patient-Friendly Summary of the ACR Appropriateness Criteria: Management of Uterine Fibroids: 2024 Update. Louella GORMAN Lizette RONAL Journal of the [employer] of Radiology : JACR. 2025;:S1546-1440(25)00108-5. doi:10.1016/j.jacr.2025.02.006. 8. Uterine Fibroids. Jackquline NASH, Laughlin-Tommaso SK. The [place] Journal of Medicine. 2024;391(18):1721-1733. doi:10.1056/NEJMcp2309623. 9. Uterine Fibroids: Diagnosis and Treatment. Everitt Clint Breed MS, Lawrance EM. American Family Physician. 2017;95(2):100-107.       Vicky Charleston, PA-C 03/28/24 9780    Theadore Ozell CHRISTELLA, MD 03/28/24 [phone]

## 2024-03-27 NOTE — ED Triage Notes (Signed)
 Pt states she did not menstruate for a year, started having vaginal bleeding 4 days ago and states she is having heavy bleeding, pt states she is using 5-10 tampons and pads an hour. C/o lower abdominal pain.

## 2024-03-28 ENCOUNTER — Emergency Department (HOSPITAL_COMMUNITY)

## 2024-03-28 NOTE — Discharge Instructions (Signed)
 You need to follow-up with an OBGYN.  Please make an appointment.    Your ultrasound showed the following: ### Gynecologic Ultrasound Findings     Here is an explanation of your recent ultrasound findings in simple terms:      **1. Thickened endometrium (12 millimeters) in a postmenopausal woman**      The endometrium is the lining inside the uterus. After menopause, this lining usually becomes very thin. If it is thicker than expected (like 12 millimeters), it can sometimes mean there is an abnormal growth, such as a polyp, overgrowth of cells (hyperplasia), or, rarely, cancer. Most of the time, especially if there is no bleeding, the cause is benign (not cancer), but the risk of cancer increases when the thickness is above 10 millimeters. Because of this, a sample of the lining (called endometrial sampling or biopsy) is recommended to make sure there is no cancer or other serious condition.[1][2][3]      **2. Left ovarian cyst (3.2 centimeters)**      An ovarian cyst is a fluid-filled sac on the ovary. These are very common and usually harmless, especially if they are simple (just fluid inside). Most cysts go away on their own. For cysts of this size in women who have gone through menopause, it is recommended to repeat the ultrasound in 3 to 6 months to make sure it does not grow or change. This advice does not apply to girls who have not started their periods or to women with a higher risk of ovarian cancer (such as those with certain genetic conditions or a strong family history).[4] Most simple cysts do not need surgery and are not cancer.[5][4]      **3. Small uterine fibroids**      Fibroids are non-cancerous growths in the muscle wall of the uterus. They are very common and often do not cause any symptoms. Many women have fibroids and never know it. If fibroids are small and not causing problems like heavy bleeding or pain, no treatment is needed. They often shrink after  menopause.[6][7][8][9]      **4. No evidence of ovarian torsion**      Ovarian torsion means the ovary has twisted, which can cut off its blood supply and cause severe pain. Your ultrasound did not show any signs of this problem, which is good news.      **Summary**      - The thickened uterine lining should be checked with a biopsy to rule out cancer, even if you have no symptoms.      - The ovarian cyst is likely harmless, but a follow-up ultrasound in a few months is recommended.      - Small fibroids are common and usually do not need treatment unless they cause symptoms.      - There is no sign of a twisted ovary, which is reassuring.      If you have any new symptoms, such as vaginal bleeding, pelvic pain, or changes in your health, let your healthcare provider know right away. These findings are common and most are not serious, but checking them carefully helps keep you safe.[6][7][8][9][1][2][3][5][4]      ### References  1. Hysteroscopy for Asymptomatic Postmenopausal Women With Sonographically Thickened Endometrium. Porter ONEIDA Haidee CHRISTELLA Maile JULIANNA, et al. Robin. 2009;62(2):176-8. doi:10.1016/j.maturitas.2008.11.018. 2. Clinicopathological Features of Endometrial Lesions in Asymptomatic Postmenopausal Women With Thickened Endometrium. Mozell JULIANNA Cesario CINDERELLA Cesario CINDERELLA, et al. Menopause ([place] , [place] 2022;29(8):952-956. doi:10.1097/GME.0000000000001993. 3. The Relationship Between Endometrial Thickening  and Endometrial Lesions in Postmenopausal Women. Yao L, Li C, Cheng J. Archives of Gynecology and Obstetrics. 2022;306(6):2047-2054. doi:10.1007/s00404-022-06734-7. 4. Simple Adnexal Cysts: SRU Consensus Conference Update on Follow-Up and Reporting. Claryce JONETTA Blanch MD, Suh-Burgmann EJ, et al. Radiology. 2019;293(2):359-371. doi:10.1148/radiol.7980808645. 5. Lesions of the Ovary and Fallopian Tube. Sisodia RC, Del Carmen MG. The [place] Journal of Medicine. 2022;387(8):727-736.  doi:10.1056/NEJMra2108956. 6. Uterine Fibroids. Jackquline EA. The [place] Journal of Medicine. 2015;372(17):1646-55. doi:10.1056/NEJMcp1411029. 7. Patient-Friendly Summary of the ACR Appropriateness Criteria: Management of Uterine Fibroids: 2024 Update. Louella GORMAN Lizette RONAL Journal of the [employer] of Radiology : JACR. 2025;:S1546-1440(25)00108-5. doi:10.1016/j.jacr.2025.02.006. 8. Uterine Fibroids. Jackquline NASH, Laughlin-Tommaso SK. The [place] Journal of Medicine. 2024;391(18):1721-1733. doi:10.1056/NEJMcp2309623. 9. Uterine Fibroids: Diagnosis and Treatment. Everitt Clint Breed MS, Lawrance EM. American Family Physician. 2017;95(2):100-107.

## 2024-03-28 NOTE — ED Notes (Addendum)
 This tech chaperoned US  tech; Ultrasound complete

## 2024-06-06 ENCOUNTER — Ambulatory Visit (INDEPENDENT_AMBULATORY_CARE_PROVIDER_SITE_OTHER): Admitting: Obstetrics and Gynecology

## 2024-06-06 ENCOUNTER — Other Ambulatory Visit (HOSPITAL_COMMUNITY)
Admission: RE | Admit: 2024-06-06 | Discharge: 2024-06-06 | Disposition: A | Discharge status: Home / Self Care | Source: Ambulatory Visit | Attending: Obstetrics and Gynecology | Admitting: Obstetrics and Gynecology

## 2024-06-06 ENCOUNTER — Encounter: Payer: Self-pay | Admitting: Obstetrics and Gynecology

## 2024-06-06 ENCOUNTER — Other Ambulatory Visit (HOSPITAL_COMMUNITY)
Admission: RE | Admit: 2024-06-06 | Discharge: 2024-06-06 | Disposition: A | Discharge status: Home / Self Care | Source: Ambulatory Visit | Attending: Obstetrics | Admitting: Obstetrics

## 2024-06-06 ENCOUNTER — Other Ambulatory Visit: Payer: Self-pay

## 2024-06-06 VITALS — BP 102/69 | HR 77 | Ht 68.5 in | Wt 151.0 lb

## 2024-06-06 DIAGNOSIS — Z1331 Encounter for screening for depression: Secondary | ICD-10-CM | POA: Diagnosis not present

## 2024-06-06 DIAGNOSIS — Z3202 Encounter for pregnancy test, result negative: Secondary | ICD-10-CM | POA: Diagnosis not present

## 2024-06-06 DIAGNOSIS — N950 Postmenopausal bleeding: Secondary | ICD-10-CM | POA: Diagnosis present

## 2024-06-06 DIAGNOSIS — Z124 Encounter for screening for malignant neoplasm of cervix: Secondary | ICD-10-CM | POA: Insufficient documentation

## 2024-06-06 DIAGNOSIS — Z79810 Long term (current) use of selective estrogen receptor modulators (SERMs): Secondary | ICD-10-CM

## 2024-06-06 DIAGNOSIS — N83202 Unspecified ovarian cyst, left side: Secondary | ICD-10-CM | POA: Diagnosis not present

## 2024-06-06 LAB — POCT URINE PREGNANCY: Preg Test, Ur: NEGATIVE

## 2024-06-06 NOTE — Progress Notes (Signed)
 Pt states she had bleeding 4 months ago, prior to that she had no bleeding for a year.   Pt states it was heavy and very painful.  Pt states maternal Grmother had several cancers, including ovarian.

## 2024-06-06 NOTE — Progress Notes (Signed)
 NEW GYNECOLOGY VISIT Chief Complaint  Patient presents with   Gynecologic Exam   New Patient (Initial Visit)     Subjective:  Brittney Price is a 48 y.o. G3P0 who presents for postmenopausal bleeding.  She had no period for a year, then in August had an episode of heavy bleeding for 4 days. She had a ultrasound with thickened endometrium. She reports another episode of bleeding for several days earlier this month.  Lab Results  Component Value Date   HGB 11.9 (L) 03/27/2024     History of breast cancer 2023, on tamoxifen   On PHQ9, she did indicate that she had history of suicidal thoughts but reports none recently and states she has no plans to kill herself or cause herself harm. She does not see a therapist but has the name of someone that she plans to see.  Pelvic ultrasound 03/2024 IMPRESSION: 1. Thickened endometrium measuring 12 mm. Endometrial thickness is considered abnormal for an asymptomatic post-menopausal female. Endometrial sampling should be considered to exclude carcinoma. 2. Left ovarian cyst measuring 3.2 cm. Recommend followup US  in 3-6 months. Note: This recommendation does not apply to premenarchal patients or to those with increased risk (genetic, family history, elevated tumor markers or other high-risk factors) of ovarian cancer. Reference: Radiology 2019 Nov; 293(2):359-371. 3. Small uterine fibroids. 4. No evidence of ovarian torsion.     06/06/2024    3:38 PM 08/24/2021    8:47 AM  Depression screen PHQ 2/9  Decreased Interest 2 0  Down, Depressed, Hopeless 2 3  PHQ - 2 Score 4 3  Altered sleeping 2 1  Tired, decreased energy 2 0  Change in appetite 2 3  Feeling bad or failure about yourself  0 0  Trouble concentrating 0 3  Moving slowly or fidgety/restless 0 0  Suicidal thoughts 1 0  PHQ-9 Score 11 10  Difficult doing work/chores  Not difficult at all      06/06/2024    3:38 PM  GAD 7 : Generalized Anxiety Score  Nervous, Anxious,  on Edge 0  Control/stop worrying 1  Worry too much - different things 1  Trouble relaxing 1  Restless 1  Easily annoyed or irritable 1  Afraid - awful might happen 0  Total GAD 7 Score 5       Last pap:  Lab Results  Component Value Date   DIAGPAP  01/12/2021    - Negative for intraepithelial lesion or malignancy (NILM)   HPV Negative 08/19/2021   HPVHIGH Negative 01/12/2021   History of abnormal pap: denies    OB History     Gravida  3   Para      Term      Preterm      AB      Living  3      SAB      IAB      Ectopic      Multiple      Live Births              Past Medical History:  Diagnosis Date   Breast CA (HCC)    Dx 2023   Family history of breast cancer 01/27/2021   Family history of colon cancer 01/27/2021   Personal history of radiation therapy     Past Surgical History:  Procedure Laterality Date   ANKLE SURGERY     BREAST BIOPSY Right 2023   BREAST BIOPSY Right 2022   BREAST LUMPECTOMY Right  2022   BREAST LUMPECTOMY WITH RADIOACTIVE SEED LOCALIZATION Right 03/02/2021   Procedure: RIGHT BREAST LUMPECTOMY WITH RADIOACTIVE SEED LOCALIZATION;  Surgeon: Belinda Cough, MD;  Location: [facility] OR;  Service: General;  Laterality: Right;    Social History   Socioeconomic History   Marital status: Married    Spouse name: Not on file   Number of children: 3   Years of education: Not on file   Highest education level: 9th grade  Occupational History   Not on file  Tobacco Use   Smoking status: Former    Current packs/day: 0.50    Types: Cigarettes   Smokeless tobacco: Never  Vaping Use   Vaping status: Every Day   Substances: Nicotine  Substance and Sexual Activity   Alcohol use: Yes    Comment: occasional   Drug use: Yes    Frequency: 2.0 times per week    Types: Marijuana   Sexual activity: Yes  Other Topics Concern   Not on file  Social History Narrative   Not on file   Social Drivers of Health   Financial Resource  Strain: High Risk (01/24/2024)   Received from [facility]   Overall Financial Resource Strain (CARDIA)    Difficulty of Paying Living Expenses: Hard  Food Insecurity: Food Insecurity Present (01/24/2024)   Received from [facility]   Hunger Vital Sign    Within the past 12 months, you worried that your food would run out before you got the money to buy more.: Sometimes true    Within the past 12 months, the food you bought just didn't last and you didn't have money to get more.: Sometimes true  Transportation Needs: No Transportation Needs (01/24/2024)   Received from [facility] - Transportation    Lack of Transportation (Medical): No    Lack of Transportation (Non-Medical): No  Physical Activity: Unknown (01/24/2024)   Received from [facility]   Exercise Vital Sign    On average, how many days per week do you engage in moderate to strenuous exercise (like a brisk walk)?: 0 days    Minutes of Exercise per Session: Not on file  Stress: Stress Concern Present (01/24/2024)   Received from [facility] - Occupational Stress Questionnaire    Feeling of Stress : Rather much  Social Connections: Socially Integrated (01/24/2024)   Received from [facility]   Social Network    How would you rate your social network (family, work, friends)?: Good participation with social networks    Family History  Problem Relation Age of Onset   Throat cancer Father        d. 56   Cancer Maternal Aunt        unknown type; dx 60s   Breast cancer Maternal Grandmother 55   Colon cancer Maternal Grandmother        dx mid-late 45s    Current Outpatient Medications on File Prior to Visit  Medication Sig Dispense Refill   gabapentin  (NEURONTIN ) 300 MG capsule Take 300 mg by mouth daily.     hydrOXYzine (ATARAX) 10 MG tablet Take by mouth.     lansoprazole (PREVACID) 30 MG capsule      PAXLOVID, 300/100, 20 x 150 MG & 10 x 100MG  TBPK Take by  mouth.     tamoxifen  (NOLVADEX ) 20 MG tablet Take 1 tablet (20 mg total) by mouth daily. 90 tablet 3   traZODone (DESYREL) 50 MG tablet Take  50 mg by mouth.     acetaminophen  (TYLENOL ) 500 MG tablet Take 500 mg by mouth every 6 (six) hours as needed for moderate pain.     ibuprofen  (ADVIL ) 200 MG tablet Take 400-600 mg by mouth every 6 (six) hours as needed for moderate pain. (Patient not taking: Reported on 08/24/2021)     Multiple Vitamins-Minerals (MULTIVITAMIN WITH MINERALS) tablet Take 1 tablet by mouth daily.     [DISCONTINUED] fluticasone  (FLONASE ) 50 MCG/ACT nasal spray Place 1 spray into both nostrils daily. (Patient not taking: No sig reported) 16 g 2   No current facility-administered medications on file prior to visit.    No Known Allergies   Objective:   Vitals:   06/06/24 1519  Weight: 151 lb (68.5 kg)  Height: 5' 8.5 (1.74 m)   Physical Examination:   General appearance - well appearing, and in no distress  Mental status - alert, oriented to person, place, and time  Psych:  normal mood and affect  Skin - warm and dry, normal color, no suspicious lesions noted  Abdomen - soft, nontender, nondistended, no masses or organomegaly  Pelvic -  VULVA: normal appearing vulva with no masses, tenderness or lesions   VAGINA: normal appearing vagina with normal color and discharge, no lesions   CERVIX: large cervix with prominent ectropion, otherwise normal appearing cervix without discharge or lesions, no CMT  Thin prep pap is done with HR HPV cotesting  UTERUS: uterus is felt to be normal size, shape, consistency and nontender   ADNEXA: No adnexal masses or tenderness noted.  Extremities:  No swelling or varicosities noted  Chaperone present for exam     ENDOMETRIAL BIOPSY     The indications for endometrial biopsy were reviewed.   Risks of the biopsy including cramping, bleeding, infection, uterine perforation, inadequate specimen and need for additional procedures were  discussed.  The patient states she understands the R/B/I/A and agrees to undergo procedure today. Urine pregnancy test was Negative. Consent was signed. Time out was performed.    Patient was positioned in dorsal lithotomy position. A vaginal speculum was placed.  Hurricane spray applied.  The cervix was visualized and was prepped with Betadine.  A single-toothed tenaculum was placed on the anterior lip of the cervix to stabilize it. The 3 mm pipelle was easily introduced into the endometrial cavity without difficulty to a depth of 9 cm, and a Moderate amount of tissue was obtained after two passes and sent to pathology. The instruments were removed from the patient's vagina. Minimal bleeding from the cervix was noted. The patient tolerated the procedure well.   Patient was given post procedure instructions.  Will follow up pathology and manage accordingly; patient will be contacted with results and recommendations.      Assessment and Plan:  1. Postmenopausal bleeding (Primary) With thickened endometrium, possible side effect from tamoxifen . Recommend biopsy to exclude hyperplasia/cancer which was performed without incident. If biopsy normal and bleeding does not recur, ok for observation - POCT urine pregnancy - Surgical pathology  2. Long-term current use of tamoxifen  Likely etiology for thickened endometrium  3. Cyst of left ovary Small cyst noted, will f/u for resolution - US  PELVIC COMPLETE WITH TRANSVAGINAL; Future  4. Positive screening for depression on 9-item Patient Health Questionnaire (PHQ-9) On PHQ9, she did indicate that she had history of suicidal thoughts but reports none recently and states she has no plans to kill herself or cause herself harm. She does not see a therapist  but has the name of someone that she plans to see. Declines BH referral today. No danger to self at this time  5. Cervical cancer screening Pap collected in the setting of postmenopausal bleeding and  prominent cervix on exam    No follow-ups on file.  Future Appointments  Date Time Provider Department Center  10/01/2024  3:15 PM Odean Potts, MD [facility] None    Rollo ONEIDA Bring, MD, FACOG Obstetrician & Gynecologist, [facility] for [facility], [facility]

## 2024-06-07 LAB — CYTOLOGY - PAP
Comment: NEGATIVE
Diagnosis: NEGATIVE
High risk HPV: NEGATIVE

## 2024-06-09 ENCOUNTER — Ambulatory Visit: Payer: Self-pay | Admitting: Obstetrics and Gynecology

## 2024-06-10 ENCOUNTER — Ambulatory Visit (HOSPITAL_COMMUNITY)
Admission: RE | Admit: 2024-06-10 | Discharge: 2024-06-10 | Disposition: A | Discharge status: Home / Self Care | Payer: Self-pay | Source: Ambulatory Visit | Attending: Obstetrics and Gynecology | Admitting: Obstetrics and Gynecology

## 2024-06-10 DIAGNOSIS — N83202 Unspecified ovarian cyst, left side: Secondary | ICD-10-CM | POA: Insufficient documentation

## 2024-06-10 LAB — SURGICAL PATHOLOGY

## 2024-07-09 ENCOUNTER — Telehealth: Payer: Self-pay | Admitting: Obstetrics and Gynecology

## 2024-09-10 ENCOUNTER — Telehealth: Payer: Self-pay | Admitting: Hematology and Oncology

## 2024-09-10 NOTE — Telephone Encounter (Signed)
 I LEFT MESSAGE FOR PATIENT TO RETURN MY CALL IF MD APPOINTMENT ON 10/15/2024 DOES NOT WORK FOR HER SCHEDULE.

## 2024-10-01 ENCOUNTER — Inpatient Hospital Stay: Payer: Medicaid Other | Admitting: Hematology and Oncology

## 2024-10-15 ENCOUNTER — Inpatient Hospital Stay: Payer: Self-pay | Admitting: Hematology and Oncology
